# Patient Record
Sex: Female | Born: 1995 | Race: Black or African American | Hispanic: No | Marital: Single | Smoking: Never smoker
Health system: Southern US, Community
[De-identification: ages and names within clinical notes are randomized; demographics above are authoritative.]

## PROBLEM LIST (undated history)

## (undated) DIAGNOSIS — T7840XA Allergy, unspecified, initial encounter: Secondary | ICD-10-CM

## (undated) DIAGNOSIS — Z789 Other specified health status: Secondary | ICD-10-CM

## (undated) HISTORY — PX: NO PAST SURGERIES: SHX2092

---
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
## 2017-01-10 ENCOUNTER — Encounter (HOSPITAL_COMMUNITY): Payer: Self-pay | Admitting: Emergency Medicine

## 2017-01-10 ENCOUNTER — Emergency Department (HOSPITAL_COMMUNITY)
Admission: EM | Admit: 2017-01-10 | Discharge: 2017-01-10 | Disposition: A | Discharge status: Home / Self Care | Payer: Medicaid Other | Attending: Emergency Medicine | Admitting: Emergency Medicine

## 2017-01-10 DIAGNOSIS — N760 Acute vaginitis: Secondary | ICD-10-CM | POA: Insufficient documentation

## 2017-01-10 DIAGNOSIS — R102 Pelvic and perineal pain: Secondary | ICD-10-CM | POA: Diagnosis present

## 2017-01-10 DIAGNOSIS — B9689 Other specified bacterial agents as the cause of diseases classified elsewhere: Secondary | ICD-10-CM | POA: Insufficient documentation

## 2017-01-10 DIAGNOSIS — Z79899 Other long term (current) drug therapy: Secondary | ICD-10-CM | POA: Insufficient documentation

## 2017-01-10 DIAGNOSIS — N3000 Acute cystitis without hematuria: Secondary | ICD-10-CM | POA: Insufficient documentation

## 2017-01-10 LAB — WET PREP, GENITAL
SPERM: NONE SEEN
Trich, Wet Prep: NONE SEEN
Yeast Wet Prep HPF POC: NONE SEEN

## 2017-01-10 LAB — I-STAT BETA HCG BLOOD, ED (MC, WL, AP ONLY): I-stat hCG, quantitative: <5 m[IU]/mL (ref <5)

## 2017-01-10 LAB — URINALYSIS, ROUTINE W REFLEX MICROSCOPIC
BILIRUBIN URINE: NEGATIVE
GLUCOSE, UA: NEGATIVE mg/dL
KETONES UR: NEGATIVE mg/dL
Nitrite: NEGATIVE
PH: 5 (ref 5.0–8.0)
Protein, ur: 100 mg/dL — AB
Specific Gravity, Urine: 1.025 (ref 1.005–1.030)

## 2017-01-10 MED ORDER — METRONIDAZOLE 500 MG PO TABS: 500.0000 mg | ORAL_TABLET | Freq: Two times a day (BID) | ORAL | 0 refills | Status: DC

## 2017-01-10 MED ORDER — FOSFOMYCIN TROMETHAMINE 3 G PO PACK
3.0000 g | PACK | Freq: Once | ORAL | Status: AC
  Administered 2017-01-10: 3 g via ORAL
  Filled 2017-01-10 (×2): qty 3

## 2017-01-10 NOTE — ED Notes (Signed)
[facility]

## 2017-01-10 NOTE — ED Triage Notes (Addendum)
Pt from home with c/o vaginal pain and bleeding, worse with movement, since an IUD was placed 12/11/16.  Pt denies vaginal discharge.  Pt additionally reports burning after urination.  NAD, ambulatory, A&O.

## 2017-01-10 NOTE — Discharge Instructions (Signed)
You were treated today in the emergency department for suspected urinary tract infection. You were wet prep does show bacterial vaginosis, which is infection in the vagina caused by a change in your pH balance. You will be treated with metronidazole. Do not drink alcohol with this medication as it can cause her to become violently ill. Please follow-up with your OB/GYN if she continued to have pain.  Marland Kitchen.Please seek immediate care if you develop the following: There is back pain.  Your symptoms are no better or worse in 3 days. There is severe back pain or lower abdominal pain.  You develop chills.  You have a fever.  There is nausea or vomiting.  There is continued burning or discomfort with urination.

## 2017-01-10 NOTE — ED Provider Notes (Signed)
[facility]-EMERGENCY DEPT Provider Note   CSN: [id number] Arrival date & time: 01/10/17  0705     History   Chief Complaint Chief Complaint  Patient presents with  . Foreign Body in Vagina    HPI Stephanie Vazquez is a 21 y.o. female who presents emergency Department with chief complaint of vaginal pain. She had a drug-eluting IUD placed 1 month ago. One week ago she began having stabbing pain that she feels like is in her vagina. She also noted burning with urination. She denies any vaginal discharge, dyspareunia. She denies pelvic Pain, fevers, flank pain. She has no history of urinary tract infections.  HPI  Past Medical History:  Diagnosis Date  . Allergy    seasonal  . Medical history non-contributory     Patient Active Problem List   Diagnosis Date Noted  . MDD (major depressive disorder), recurrent episode, moderate (HCC) 10/06/2013  . ODD (oppositional defiant disorder) 10/06/2013    Past Surgical History:  Procedure Laterality Date  . NO PAST SURGERIES      OB History    No data available       Home Medications    Prior to Admission medications   Medication Sig Start Date End Date Taking? Authorizing Provider  HYDROcodone-acetaminophen (NORCO/VICODIN) 5-325 MG tablet 1/2 to 1 tablet every 4 hours prn pain Patient not taking: Reported on 01/10/2017 04/04/16   Hayden Rasmussenavid Mabe, NP  loratadine (CLARITIN) 10 MG tablet Take 1 tablet (10 mg total) by mouth daily as needed for allergies. 10/12/13   Chauncey MannGlenn E Jennings, MD  mirtazapine (REMERON) 15 MG tablet Take 1 tablet (15 mg total) by mouth at bedtime and may repeat dose one time if needed. Patient not taking: Reported on 01/10/2017 10/12/13   Chauncey MannGlenn E Jennings, MD    Family History History reviewed. No pertinent family history.  Social History Social History  Substance Use Topics  . Smoking status: Never Smoker  . Smokeless tobacco: Never Used  . Alcohol use No     Allergies   Other   Review of Systems Review  of Systems Ten systems reviewed and are negative for acute change, except as noted in the HPI.    Physical Exam Updated Vital Signs BP 123/79   Pulse 89   Temp 98.2 F (36.8 C) (Oral)   Resp 16   Ht 5\' 2"  (1.575 m)   Wt 71.7 kg   LMP 12/18/2016 (Exact Date)   SpO2 100%   BMI 28.90 kg/m   Physical Exam  Constitutional: She is oriented to person, place, and time. She appears well-developed and well-nourished. No distress.  HENT:  Head: Normocephalic and atraumatic.  Eyes: Conjunctivae are normal. No scleral icterus.  Neck: Normal range of motion.  Cardiovascular: Normal rate, regular rhythm and normal heart sounds.  Exam reveals no gallop and no friction rub.   No murmur heard. Pulmonary/Chest: Effort normal and breath sounds normal. No respiratory distress.  Abdominal: Soft. Bowel sounds are normal. She exhibits no distension and no mass. There is no tenderness. There is no guarding.  Genitourinary:  Genitourinary Comments: Pelvic exam: normal external genitalia, vulva, vagina, cervix, uterus and adnexa. IUD strings present at the cervical os. Fishy vaginal odor. No CMT    Neurological: She is alert and oriented to person, place, and time.  Skin: Skin is warm and dry. She is not diaphoretic.  Nursing note and vitals reviewed.    ED Treatments / Results  Labs (all labs ordered are listed, but  only abnormal results are displayed) Labs Reviewed  WET PREP, GENITAL - Abnormal; Notable for the following:       Result Value   Clue Cells Wet Prep HPF POC PRESENT (*)    WBC, Wet Prep HPF POC FEW (*)    All other components within normal limits  URINALYSIS, ROUTINE W REFLEX MICROSCOPIC - Abnormal; Notable for the following:    APPearance CLOUDY (*)    Hgb urine dipstick LARGE (*)    Protein, ur 100 (*)    Leukocytes, UA LARGE (*)    Bacteria, UA RARE (*)    Squamous Epithelial / LPF 0-5 (*)    All other components within normal limits  URINE CULTURE  RPR  HIV ANTIBODY  (ROUTINE TESTING)  I-STAT BETA HCG BLOOD, ED ([facility], [facility], [facility] ONLY)  GC/CHLAMYDIA PROBE AMP ([place]) NOT AT [facility]    EKG  EKG Interpretation None       Radiology No results found.  Procedures Procedures (including critical care time)  Medications Ordered in ED Medications  fosfomycin (MONUROL) packet 3 g (3 g Oral Given 01/10/17 0909)     Initial Impression / Assessment and Plan / ED Course  I have reviewed the triage vital signs and the nursing notes.  Pertinent labs & imaging results that were available during my care of the patient were reviewed by me and considered in my medical decision making (see chart for details).     With a UTI. Treated here with fosfomycin.   wet prep positive for bacterial vaginosis treated with Flagyl. Follow up with OB for any continued stabbing pain if it's related to position of IUD. She appears safe for discharge and discussed return precautions.  Final Clinical Impressions(s) / ED Diagnoses   Final diagnoses:  Acute cystitis without hematuria  BV (bacterial vaginosis)    New Prescriptions New Prescriptions   No medications on file     Arthor Captain, PA-C 01/10/17 1036    Mancel Bale, MD 01/10/17 1645

## 2017-01-11 LAB — URINE CULTURE

## 2017-01-11 LAB — HIV ANTIBODY (ROUTINE TESTING W REFLEX): HIV Screen 4th Generation wRfx: NONREACTIVE

## 2017-01-11 LAB — RPR: RPR Ser Ql: NONREACTIVE

## 2017-01-11 LAB — GC/CHLAMYDIA PROBE AMP (~~LOC~~) NOT AT ARMC
Chlamydia: NEGATIVE
Neisseria Gonorrhea: NEGATIVE

## 2017-06-22 ENCOUNTER — Encounter (HOSPITAL_COMMUNITY): Payer: Self-pay

## 2017-06-22 ENCOUNTER — Emergency Department (HOSPITAL_COMMUNITY)
Admission: EM | Admit: 2017-06-22 | Discharge: 2017-06-22 | Disposition: A | Discharge status: Home / Self Care | Payer: Medicaid Other | Attending: Emergency Medicine | Admitting: Emergency Medicine

## 2017-06-22 DIAGNOSIS — J36 Peritonsillar abscess: Secondary | ICD-10-CM | POA: Diagnosis not present

## 2017-06-22 DIAGNOSIS — J029 Acute pharyngitis, unspecified: Secondary | ICD-10-CM | POA: Diagnosis present

## 2017-06-22 DIAGNOSIS — Z79899 Other long term (current) drug therapy: Secondary | ICD-10-CM | POA: Insufficient documentation

## 2017-06-22 LAB — RAPID STREP SCREEN (MED CTR MEBANE ONLY): Streptococcus, Group A Screen (Direct): NEGATIVE

## 2017-06-22 MED ORDER — CLINDAMYCIN HCL 150 MG PO CAPS: 450.0000 mg | ORAL_CAPSULE | Freq: Three times a day (TID) | ORAL | 0 refills | Status: DC

## 2017-06-22 MED ORDER — CLINDAMYCIN HCL 150 MG PO CAPS
150.0000 mg | ORAL_CAPSULE | Freq: Once | ORAL | Status: AC
  Administered 2017-06-22: 150 mg via ORAL
  Filled 2017-06-22: qty 1

## 2017-06-22 MED ORDER — DEXAMETHASONE 10 MG/ML FOR PEDIATRIC ORAL USE
10.0000 mg | Freq: Once | INTRAMUSCULAR | Status: AC
  Administered 2017-06-22: 10 mg via ORAL
  Filled 2017-06-22: qty 1

## 2017-06-22 MED ORDER — CLINDAMYCIN HCL 150 MG PO CAPS
300.0000 mg | ORAL_CAPSULE | Freq: Once | ORAL | Status: AC
  Administered 2017-06-22: 300 mg via ORAL
  Filled 2017-06-22: qty 2

## 2017-06-22 NOTE — ED Triage Notes (Signed)
Pt reports sore throat and pain with swallowing foods and liquids; onset Tuesday, associated with muffled voice. Strep swab done in triage. Tonsils appear red and swollen.

## 2017-06-22 NOTE — ED Provider Notes (Signed)
[facility] Provider Note   CSN: [id number] Arrival date & time: 06/22/17  2113     History   Chief Complaint Chief Complaint  Patient presents with  . Sore Throat    HPI Stephanie Vazquez is a 21 y.o. female.  The history is provided by the patient. No language interpreter was used.  Sore Throat     Stephanie Vazquez is a 21 y.o. female who presents to the Emergency Department complaining of sore throat.  She reports 3 days of sore throat, right greater than left. No fevers but she does have pain with swallowing and decreased oral intake. She is able to breathe without difficulty. No prior similar symptoms and no known sick contacts. She has no medical problems. Symptoms are moderate, constant, worsening.  Past Medical History:  Diagnosis Date  . Allergy    seasonal  . Medical history non-contributory     Patient Active Problem List   Diagnosis Date Noted  . MDD (major depressive disorder), recurrent episode, moderate (HCC) 10/06/2013  . ODD (oppositional defiant disorder) 10/06/2013    Past Surgical History:  Procedure Laterality Date  . NO PAST SURGERIES      OB History    No data available       Home Medications    Prior to Admission medications   Medication Sig Start Date End Date Taking? Authorizing Provider  clindamycin (CLEOCIN) 150 MG capsule Take 3 capsules (450 mg total) by mouth 3 (three) times daily. 06/22/17   Tilden Fossaees, Arlester Keehan, MD  HYDROcodone-acetaminophen (NORCO/VICODIN) 5-325 MG tablet 1/2 to 1 tablet every 4 hours prn pain Patient not taking: Reported on 01/10/2017 04/04/16   Hayden RasmussenMabe, David, NP  loratadine (CLARITIN) 10 MG tablet Take 1 tablet (10 mg total) by mouth daily as needed for allergies. 10/12/13   Chauncey MannJennings, Glenn E, MD  metroNIDAZOLE (FLAGYL) 500 MG tablet Take 1 tablet (500 mg total) by mouth 2 (two) times daily. One po bid x 7 days 01/10/17   Arthor CaptainHarris, Abigail, PA-C  mirtazapine (REMERON) 15 MG tablet Take 1 tablet (15 mg total) by mouth  at bedtime and may repeat dose one time if needed. Patient not taking: Reported on 01/10/2017 10/12/13   Chauncey MannJennings, Glenn E, MD    Family History No family history on file.  Social History Social History  Substance Use Topics  . Smoking status: Never Smoker  . Smokeless tobacco: Never Used  . Alcohol use No     Allergies   Other   Review of Systems Review of Systems  All other systems reviewed and are negative.    Physical Exam Updated Vital Signs BP 119/79 (BP Location: Left Arm)   Pulse (!) 101   Temp 99.8 F (37.7 C) (Oral)   Resp 16   SpO2 100%   Physical Exam  Constitutional: She is oriented to person, place, and time. She appears well-developed and well-nourished.  HENT:  Head: Normocephalic and atraumatic.  Mild erythema of the posterior oropharynx, right greater than left. There is mild fullness of the right tonsil and palate.  Neck: Neck supple.  Cardiovascular: Normal rate and regular rhythm.   No murmur heard. Pulmonary/Chest: Effort normal and breath sounds normal. No stridor. No respiratory distress.  Abdominal: Soft. There is no tenderness. There is no rebound and no guarding.  Musculoskeletal: She exhibits no edema or tenderness.  Lymphadenopathy:    She has cervical adenopathy.  Neurological: She is alert and oriented to person, place, and time.  Skin: Skin  is warm and dry.  Psychiatric: She has a normal mood and affect. Her behavior is normal.  Nursing note and vitals reviewed.    ED Treatments / Results  Labs (all labs ordered are listed, but only abnormal results are displayed) Labs Reviewed  RAPID STREP SCREEN (NOT AT [facility])  CULTURE, GROUP A STREP [facility])    EKG  EKG Interpretation None       Radiology No results found.  Procedures Procedures (including critical care time)  Medications Ordered in ED Medications  dexamethasone (DECADRON) 10 MG/ML injection for Pediatric ORAL use 10 mg (10 mg Oral Given 06/22/17 2321)    clindamycin (CLEOCIN) capsule 300 mg (300 mg Oral Given 06/22/17 2321)  clindamycin (CLEOCIN) capsule 150 mg (150 mg Oral Given 06/22/17 2321)     Initial Impression / Assessment and Plan / ED Course  I have reviewed the triage vital signs and the nursing notes.  Pertinent labs & imaging results that were available during my care of the patient were reviewed by me and considered in my medical decision making (see chart for details).     Patient here for evaluation of sore throat. She is nontoxic appearing on examination and well-hydrated. She does have exam findings are concerning for developing peritonsillar abscess. Symptoms are early and exam is not consistent with deep or drainable abscess. Will treat with steroids, clindamycin. Counseled pt on home care with close return precautions if she has worsening symptoms.  Final Clinical Impressions(s) / ED Diagnoses   Final diagnoses:  Peritonsillar abscess    New Prescriptions New Prescriptions   CLINDAMYCIN (CLEOCIN) 150 MG CAPSULE    Take 3 capsules (450 mg total) by mouth 3 (three) times daily.     Tilden Fossaees, Lenor Provencher, MD 06/22/17 [phone]

## 2017-06-24 LAB — CULTURE, GROUP A STREP (THRC)

## 2018-05-19 IMAGING — MR MR KNEE*L* W/O CM
4 of 5 series · 31 of 40 positions shown · non-contrast
Comparison: Radiographs 04/04/2016.

CLINICAL DATA: Anterior left knee pain for 3 weeks after dancing.
Worsening pain with weight-bearing. No previous relevant surgery.

EXAM:
MRI OF THE LEFT KNEE WITHOUT CONTRAST
TECHNIQUE: Multiplanar, multisequence MR imaging of the knee was performed. No
intravenous contrast was administered.

[Series 6: PD fat-sat · axial · left · 3.0mm · 0.39mm/px · z∈[-44,+71]mm · 9 of 36 slices shown (1 of 3)]
[im 1/36]
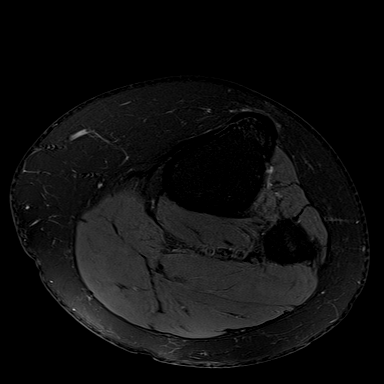
[im 5/36]
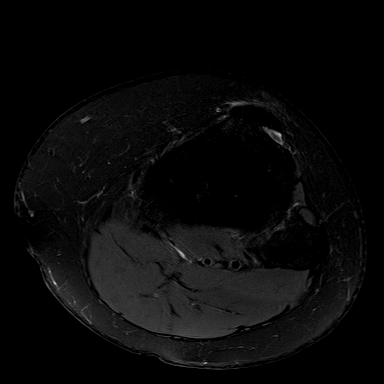
[im 9/36]
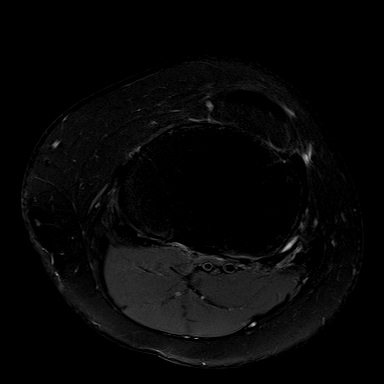
[im 14/36]
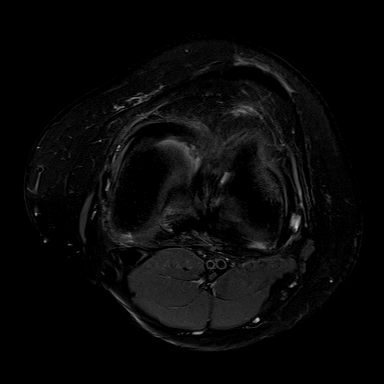
[im 18/36]
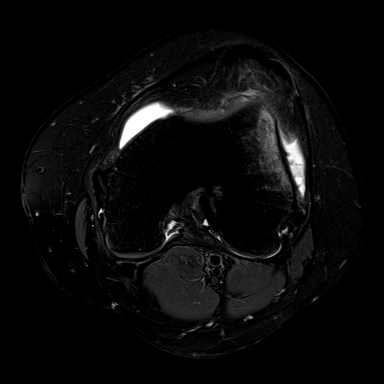
[im 22/36]
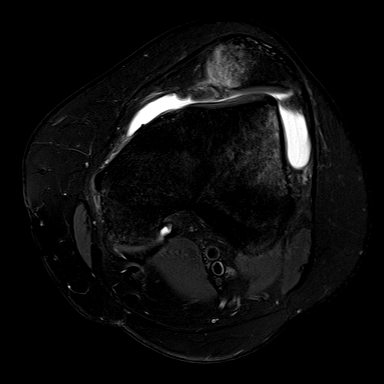
[im 27/36]
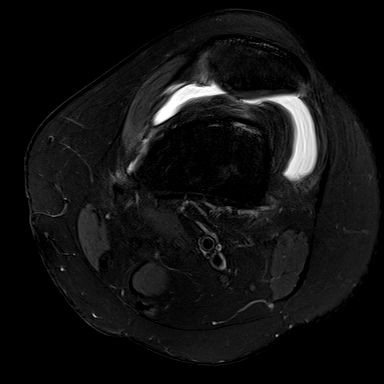
[im 31/36]
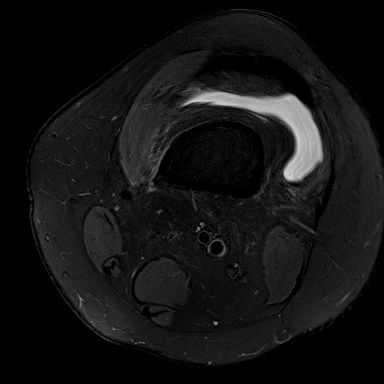
[im 36/36]
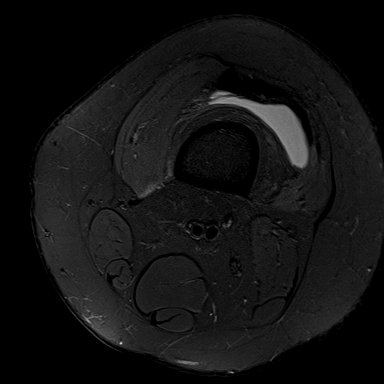

[Series 8: PD fat-sat · coronal · left · 3.0mm · 0.33mm/px · 8 of 33 slices shown (2 of 3)]
[im 1/33]
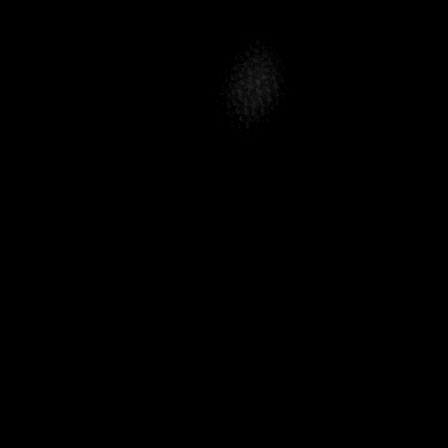
[im 5/33]
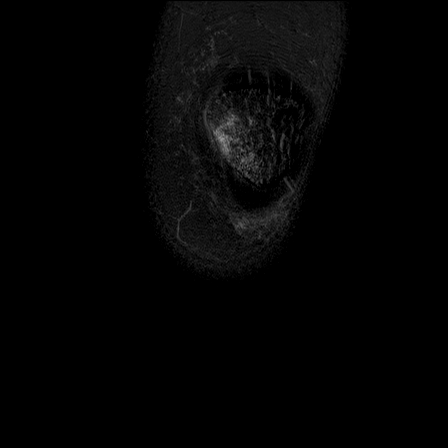
[im 10/33]
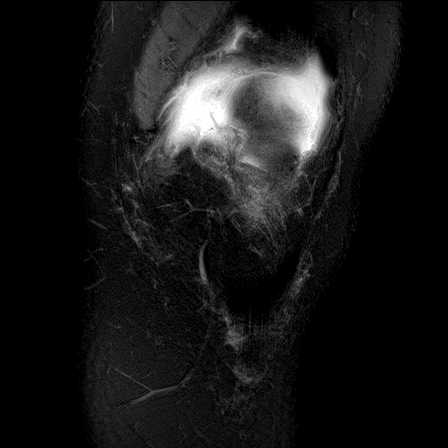
[im 14/33]
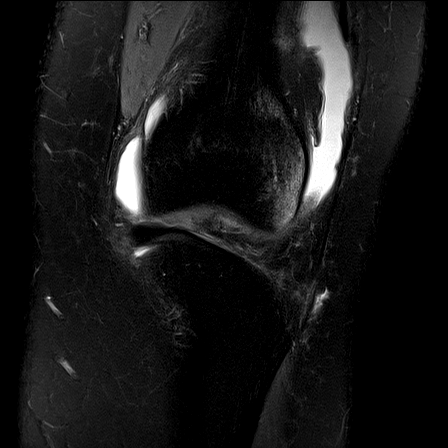
[im 19/33]
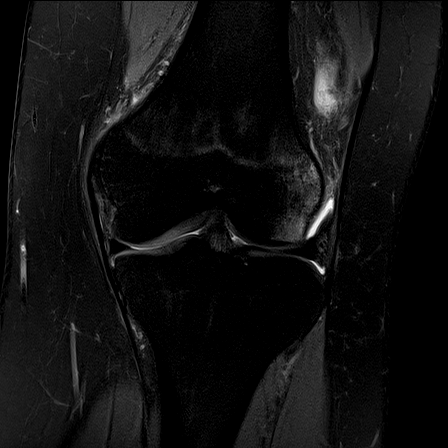
[im 23/33]
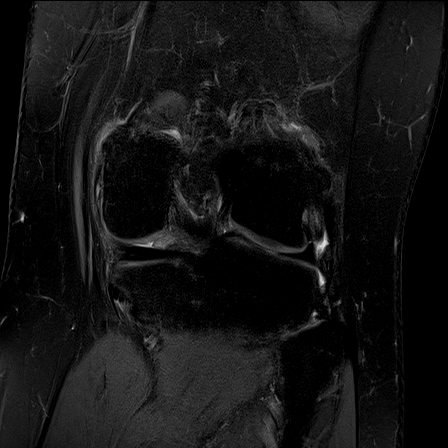
[im 28/33]
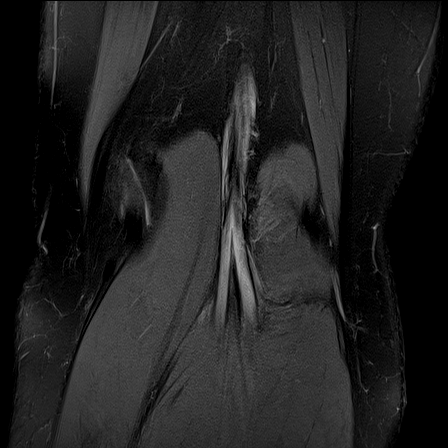
[im 33/33]
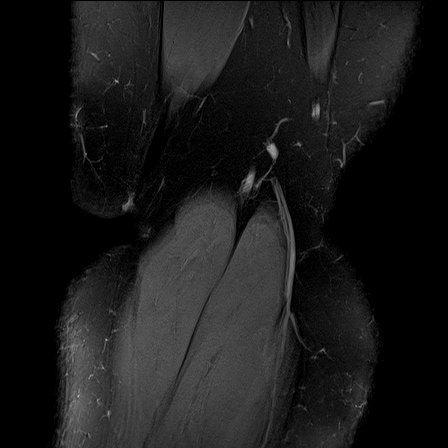

[Series 9: PD fat-sat · sagittal · left · 3.0mm · 0.39mm/px · 7 of 27 slices shown (3 of 3)]
[im 1/27]
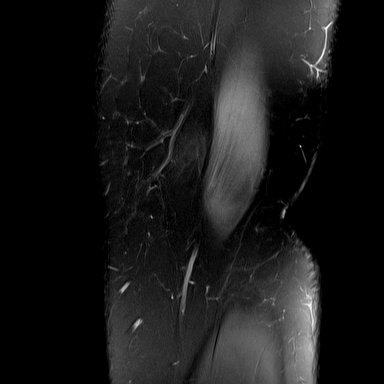
[im 5/27]
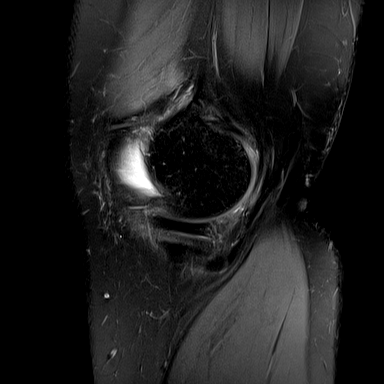
[im 9/27]
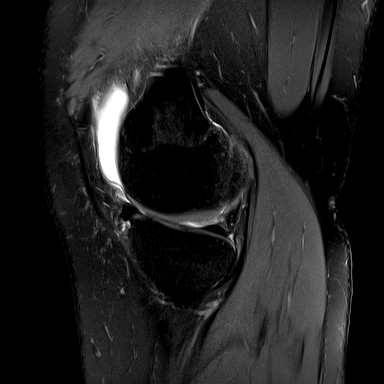
[im 14/27]
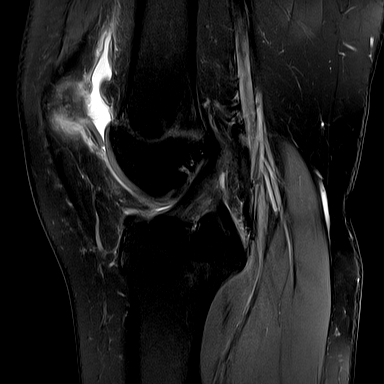
[im 18/27]
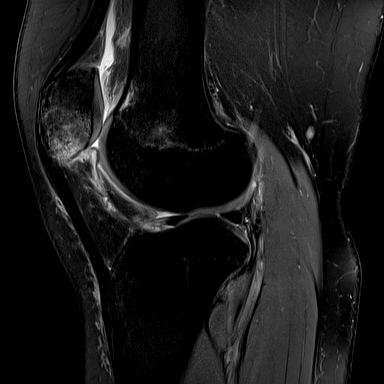
[im 22/27]
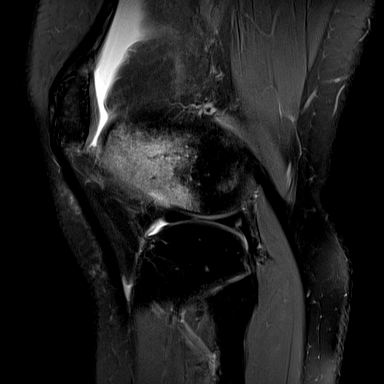
[im 27/27]
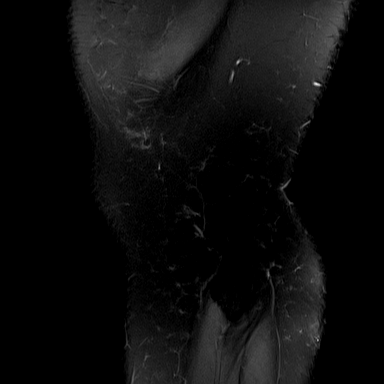

[Series 10: T2 fat-sat · coronal · left · 3.0mm · 0.39mm/px · 7 of 33 slices shown]
[im 1/33]
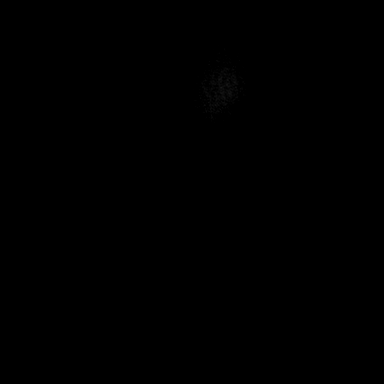
[im 5/33]
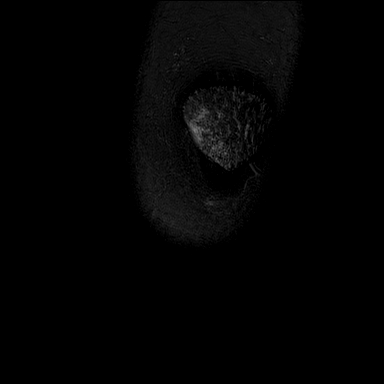
[im 10/33]
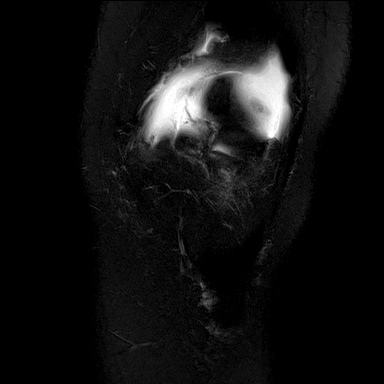
[im 14/33]
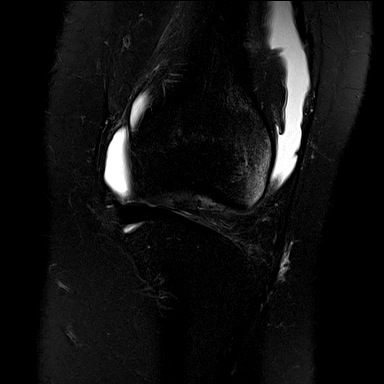
[im 19/33]
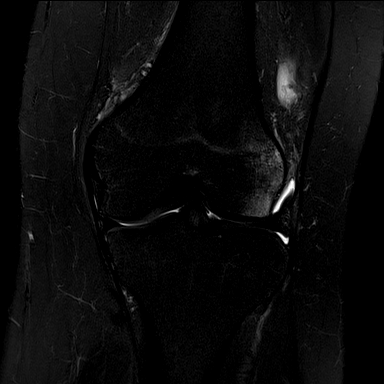
[im 23/33]
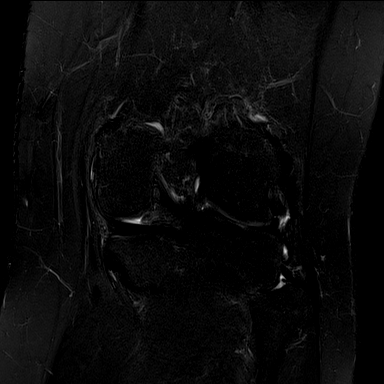
[im 28/33]
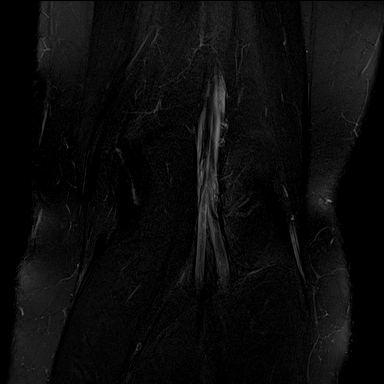

[31 of 40 positions shown; findings below may reference images not displayed]

FINDINGS: MENISCI

Medial meniscus:  Intact with normal morphology.

Lateral meniscus:  Intact with normal morphology.

LIGAMENTS

Cruciates:  Intact.

Collaterals:  Intact.

CARTILAGE

Patellofemoral: There is possible mild irregularity of the cartilage
over the inferior aspect of the medial patellar facet without
apparent full-thickness defect. The trochlear cartilage appears
normal.

Medial:  Preserved.

Lateral:  Preserved.

OTHER

Joint:  Moderate-sized joint effusion.

Popliteal Fossa:  Unremarkable. No significant Baker's cyst.

Extensor Mechanism: Intact. There is lateral tilting of the patella.
There is edema along the patellar attachment of the medial
patellofemoral ligament which appears intact. The femoral trochlea
is shallow. The tibial tubercle/trochlear groove (TT-TG) distance is
17 mm.

Bones: There are bone contusions of the inferomedial patella and the
anterolateral femoral condyle consistent with transient patellar
dislocation injury. There is mild lateral tilting of the patella. A
small chondral fragment medial to patellofemoral joint on sagittal
images 13 and 14 is difficult to exclude, although this is not
clearly seen in the other planes.
IMPRESSION: 1. Findings are consistent with recent transient patellar
dislocation injury with typical osseous injuries involving the
inferior medial patella and the anterior aspect of the lateral
femoral condyle. A small chondral fragment medial to the
patellofemoral joint is difficult to exclude.
2. The medial patellofemoral ligament appears mildly edematous
medially, although intact.
3. The menisci, cruciate and collateral ligaments appear normal.

## 2018-05-23 ENCOUNTER — Ambulatory Visit (HOSPITAL_COMMUNITY)
Admission: EM | Admit: 2018-05-23 | Discharge: 2018-05-23 | Disposition: A | Discharge status: Home / Self Care | Payer: Medicaid Other | Attending: Family Medicine | Admitting: Family Medicine

## 2018-05-23 ENCOUNTER — Encounter (HOSPITAL_COMMUNITY): Payer: Self-pay

## 2018-05-23 DIAGNOSIS — B349 Viral infection, unspecified: Secondary | ICD-10-CM | POA: Diagnosis not present

## 2018-05-23 MED ORDER — ONDANSETRON 4 MG PO TBDP: 4.0000 mg | ORAL_TABLET | Freq: Three times a day (TID) | ORAL | 0 refills | Status: DC | PRN

## 2018-05-23 NOTE — Discharge Instructions (Signed)
Please use zofran as needed for nausea and vomiting. Eat blander foods to begin with. Push fluids.   Tylenol or Ibuprofen for bodyaches, avoid ibuprofen if having abdominal pain/persistent vomiting.  Follow up if symptoms changing, worsening, not getting better.

## 2018-05-23 NOTE — ED Triage Notes (Signed)
Pt presents with cramps all over body, stomache, and headaches.

## 2018-05-24 NOTE — ED Provider Notes (Signed)
[facility]    CSN: [id number] Arrival date & time: 05/23/18  1832     History   Chief Complaint Chief Complaint  Patient presents with  . cramps , heachache, vomiting    HPI Stephanie Vazquez is a 22 y.o. female no contributing past medical history presenting today for evaluation of body aches, headache and stomachache.  Patient states that she recently had a viral illness 1 week ago including cough, congestion and sore throat.  These symptoms have largely resolved.  She still has an occasional cough.  For the past couple days she has had body aches, feeling states she had a's stomachache and had one episode of vomiting this morning.  She is not eating or drinking since.  Last bowel movement was yesterday and was normal, denies diarrhea.  She has not taken anything for her symptoms.  Denies fevers.  She is mainly concerned just to make sure that she is not sick.  HPI  Past Medical History:  Diagnosis Date  . Allergy    seasonal  . Medical history non-contributory     Patient Active Problem List   Diagnosis Date Noted  . MDD (major depressive disorder), recurrent episode, moderate (HCC) 10/06/2013  . ODD (oppositional defiant disorder) 10/06/2013    Past Surgical History:  Procedure Laterality Date  . NO PAST SURGERIES      OB History   None      Home Medications    Prior to Admission medications   Medication Sig Start Date End Date Taking? Authorizing Provider  loratadine (CLARITIN) 10 MG tablet Take 1 tablet (10 mg total) by mouth daily as needed for allergies. 10/12/13  Yes Chauncey MannJennings, Glenn E, MD  clindamycin (CLEOCIN) 150 MG capsule Take 3 capsules (450 mg total) by mouth 3 (three) times daily. 06/22/17   Tilden Fossaees, Elizabeth, MD  HYDROcodone-acetaminophen (NORCO/VICODIN) 5-325 MG tablet 1/2 to 1 tablet every 4 hours prn pain Patient not taking: Reported on 01/10/2017 04/04/16   Hayden RasmussenMabe, David, NP  metroNIDAZOLE (FLAGYL) 500 MG tablet Take 1 tablet (500 mg total)  by mouth 2 (two) times daily. One po bid x 7 days 01/10/17   Arthor CaptainHarris, Abigail, PA-C  mirtazapine (REMERON) 15 MG tablet Take 1 tablet (15 mg total) by mouth at bedtime and may repeat dose one time if needed. Patient not taking: Reported on 01/10/2017 10/12/13   Chauncey MannJennings, Glenn E, MD  ondansetron (ZOFRAN ODT) 4 MG disintegrating tablet Take 1 tablet (4 mg total) by mouth every 8 (eight) hours as needed for nausea or vomiting. 05/23/18   Lew DawesWieters, Hallie C, PA-C    Family History Family History  Problem Relation Age of Onset  . Healthy Mother   . Healthy Father     Social History Social History   Tobacco Use  . Smoking status: Never Smoker  . Smokeless tobacco: Never Used  Substance Use Topics  . Alcohol use: No  . Drug use: No     Allergies   Other   Review of Systems Review of Systems  Constitutional: Positive for appetite change. Negative for activity change, chills, fatigue and fever.  HENT: Negative for congestion, ear pain, rhinorrhea, sinus pressure, sore throat and trouble swallowing.   Eyes: Negative for discharge and redness.  Respiratory: Positive for cough. Negative for chest tightness and shortness of breath.   Cardiovascular: Negative for chest pain.  Gastrointestinal: Positive for vomiting. Negative for abdominal pain, diarrhea and nausea.  Musculoskeletal: Positive for myalgias.  Skin: Negative for rash.  Neurological: Positive for headaches. Negative for dizziness and light-headedness.     Physical Exam Triage Vital Signs ED Triage Vitals  Enc Vitals Group     BP 05/23/18 1851 121/85     Pulse Rate 05/23/18 1851 80     Resp 05/23/18 1851 19     Temp 05/23/18 1851 98.2 F (36.8 C)     Temp Source 05/23/18 1851 Oral     SpO2 05/23/18 1851 100 %     Weight --      Height --      Head Circumference --      Peak Flow --      Pain Score 05/23/18 1848 0     Pain Loc --      Pain Edu? --      Excl. in GC? --    No data found.  Updated Vital Signs BP  121/85 (BP Location: Left Arm)   Pulse 80   Temp 98.2 F (36.8 C) (Oral)   Resp 19   SpO2 100%   Visual Acuity Right Eye Distance:   Left Eye Distance:   Bilateral Distance:    Right Eye Near:   Left Eye Near:    Bilateral Near:     Physical Exam  Constitutional: She is oriented to person, place, and time. She appears well-developed and well-nourished. No distress.  HENT:  Head: Normocephalic and atraumatic.  Mouth/Throat: Oropharynx is clear and moist.  Bilateral ears without tenderness to palpation of external auricle, tragus and mastoid, EAC's without erythema or swelling, TM's with good bony landmarks and cone of light. Non erythematous.  Oral mucosa pink and moist, no tonsillar enlargement or exudate. Posterior pharynx patent and nonerythematous, no uvula deviation or swelling. Normal phonation.  Eyes: Conjunctivae are normal.  Neck: Neck supple.  Cardiovascular: Normal rate and regular rhythm.  No murmur heard. Pulmonary/Chest: Effort normal and breath sounds normal. No respiratory distress.  Breathing comfortably at rest, CTABL, no wheezing, rales or other adventitious sounds auscultated  Abdominal: Soft. There is no tenderness.  Nontender to light and deep palpation throughout all 4 quadrants  Musculoskeletal: She exhibits no edema.  Neurological: She is alert and oriented to person, place, and time.  Skin: Skin is warm and dry.  Psychiatric: She has a normal mood and affect.  Nursing note and vitals reviewed.    UC Treatments / Results  Labs (all labs ordered are listed, but only abnormal results are displayed) Labs Reviewed - No data to display  EKG None  Radiology No results found.  Procedures Procedures (including critical care time)  Medications Ordered in UC Medications - No data to display  Initial Impression / Assessment and Plan / UC Course  I have reviewed the triage vital signs and the nursing notes.  Pertinent labs & imaging results that  were available during my care of the patient were reviewed by me and considered in my medical decision making (see chart for details).     Patient with body aches, one episode of vomiting mild headache.  Vital signs stable, exam unremarkable.  Recommend symptomatic management at this time.  Tylenol and ibuprofen for body aches and headache.  Zofran as needed if vomiting persists.  Discussed diet recommendations.Discussed strict return precautions. Patient verbalized understanding and is agreeable with plan.  Final Clinical Impressions(s) / UC Diagnoses   Final diagnoses:  Viral illness     Discharge Instructions     Please use zofran as needed for nausea and vomiting. Eat  blander foods to begin with. Push fluids.   Tylenol or Ibuprofen for bodyaches, avoid ibuprofen if having abdominal pain/persistent vomiting.  Follow up if symptoms changing, worsening, not getting better.   ED Prescriptions    Medication Sig Dispense Auth. Provider   ondansetron (ZOFRAN ODT) 4 MG disintegrating tablet Take 1 tablet (4 mg total) by mouth every 8 (eight) hours as needed for nausea or vomiting. 20 tablet Wieters, [place] C, PA-C     Controlled Substance Prescriptions [place] Controlled Substance Registry consulted? Not Applicable   Lew Dawes, [place] 05/24/18 1021

## 2018-10-15 ENCOUNTER — Encounter (HOSPITAL_COMMUNITY): Payer: Self-pay

## 2018-10-15 ENCOUNTER — Ambulatory Visit (HOSPITAL_COMMUNITY)
Admission: EM | Admit: 2018-10-15 | Discharge: 2018-10-15 | Disposition: A | Discharge status: Home / Self Care | Payer: Self-pay | Attending: Family Medicine | Admitting: Family Medicine

## 2018-10-15 DIAGNOSIS — L03011 Cellulitis of right finger: Secondary | ICD-10-CM

## 2018-10-15 MED ORDER — SULFAMETHOXAZOLE-TRIMETHOPRIM 800-160 MG PO TABS: 1.0000 | ORAL_TABLET | Freq: Two times a day (BID) | ORAL | 0 refills | Status: AC

## 2018-10-15 NOTE — ED Triage Notes (Signed)
Pt presents with an infection of right ring finger not related to any trauma or injury.

## 2018-10-15 NOTE — Discharge Instructions (Signed)
You may use over the counter ibuprofen or acetaminophen as needed for pain. °

## 2018-10-15 NOTE — ED Provider Notes (Signed)
[facility]   [id number] 10/15/18 Arrival Time: 1313  ASSESSMENT & PLAN:  1. Paronychia of finger of right hand     Incision and Drainage Procedure Note; Paronychia of R 4th finger  Anesthesia: ethyl chloride spray  Procedure Details  The procedure, risks and complications have been discussed in detail (including, but not limited to pain and bleeding) with the patient.  The skin induration was prepped and draped in the usual fashion. After adequate local anesthesia, I&D with a #11 blade was performed on the right 4th finger. Purulent drainage: present.  EBL: minimal; easily controlled  Drains: none  Condition: Tolerated procedure well  Complications: none.  Meds ordered this encounter  Medications  . sulfamethoxazole-trimethoprim (BACTRIM DS,SEPTRA DS) 800-160 MG tablet    Sig: Take 1 tablet by mouth 2 (two) times daily for 7 days.    Dispense:  14 tablet    Refill:  0   Follow-up Information    [facility].   Specialty:  Urgent Care Why:  If needed. Contact information: [address] [place] [address] [phone]         Wound care instructions discussed and given in written format. To return in 48 hours for wound check if needed.  Finish all antibiotics. OTC analgesics as needed.  Reviewed expectations re: course of current medical issues. Questions answered. Outlined signs and symptoms indicating need for more acute intervention. Patient verbalized understanding. After Visit Summary given.   SUBJECTIVE:  Stephanie Vazquez is a 22 y.o. female who presents with a possible abscess of her R 4th distal finger, around nail. Bites nails regularly. H/O similar in the past. Afebrile. No home/OTC treatment. Onset gradual, approximately several days ago. Now more painful and swollen. No extremity sensation changes or weakness.  ROS: As per HPI.  OBJECTIVE:  Vitals:   10/15/18 1404  BP: 122/78    Pulse: 83  Resp: 20  Temp: 98.4 F (36.9 C)  TempSrc: Oral  SpO2: 100%    General appearance: alert; no distress Skin: paronychia around nailfold of her R 5th finger; very tender to touch; no active drainage or bleeding; FROM of R 4th finger with normal capillary refill and normal distal sensation; does have erythema of R 4th finger extending from nailfold to DIP; warm to touch Psychological: alert and cooperative; normal mood and affect  Allergies  Allergen Reactions  . Other     Seasonal allergies    Past Medical History:  Diagnosis Date  . Allergy    seasonal  . Medical history non-contributory    Social History   Socioeconomic History  . Marital status: Single    Spouse name: Not on file  . Number of children: Not on file  . Years of education: Not on file  . Highest education level: Not on file  Occupational History  . Not on file  Social Needs  . Financial resource strain: Not on file  . Food insecurity:    Worry: Not on file    Inability: Not on file  . Transportation needs:    Medical: Not on file    Non-medical: Not on file  Tobacco Use  . Smoking status: Never Smoker  . Smokeless tobacco: Never Used  Substance and Sexual Activity  . Alcohol use: No  . Drug use: No  . Sexual activity: Never  Lifestyle  . Physical activity:    Days per week: Not on file    Minutes per  session: Not on file  . Stress: Not on file  Relationships  . Social connections:    Talks on phone: Not on file    Gets together: Not on file    Attends religious service: Not on file    Active member of club or organization: Not on file    Attends meetings of clubs or organizations: Not on file    Relationship status: Not on file  Other Topics Concern  . Not on file  Social History Narrative  . Not on file   Family History  Problem Relation Age of Onset  . Healthy Mother   . Healthy Father    Past Surgical History:  Procedure Laterality Date  . NO PAST SURGERIES              Mardella LaymanHagler, Caniyah Murley, MD 10/15/18 1445

## 2019-12-08 ENCOUNTER — Encounter (HOSPITAL_COMMUNITY): Payer: Self-pay

## 2019-12-08 ENCOUNTER — Other Ambulatory Visit: Payer: Self-pay

## 2019-12-08 ENCOUNTER — Ambulatory Visit (HOSPITAL_COMMUNITY)
Admission: EM | Admit: 2019-12-08 | Discharge: 2019-12-08 | Disposition: A | Discharge status: Home / Self Care | Payer: Medicaid Other | Attending: Family Medicine | Admitting: Family Medicine

## 2019-12-08 DIAGNOSIS — H9201 Otalgia, right ear: Secondary | ICD-10-CM

## 2019-12-08 DIAGNOSIS — H6121 Impacted cerumen, right ear: Secondary | ICD-10-CM

## 2019-12-08 NOTE — ED Triage Notes (Signed)
Pt states she has right ear pain. Pt states this has been going on for 2 days.

## 2019-12-08 NOTE — ED Provider Notes (Signed)
[facility]   [id number] 12/08/19 Arrival Time: 1729  ASSESSMENT & PLAN:  1. Otalgia of right ear   2. Impacted cerumen of right ear     Reports improvement after ear flushing. No signs of infection.  Follow-up Information    Pudlo, Gennie Alma, MD.   Specialty: Pediatrics Why: If worsening or failing to improve as anticipated. Contact information: [employer] [address] [place] [address] [phone]            Reviewed expectations re: course of current medical issues. Questions answered. Outlined signs and symptoms indicating need for more acute intervention. Patient verbalized understanding. After Visit Summary given.   SUBJECTIVE: History from: patient.  Stephanie Vazquez is a 24 y.o. female who presents with complaint of right otalgia; without drainage; without bleeding. Onset gradual, over the past two days. Recent cold symptoms: none. Fever: no. Overall normal PO intake without n/v. Sick contacts: no. OTC treatment: H2O2 without relief. Slightly decreased hearing.  Social History   Tobacco Use  Smoking Status Never Smoker  Smokeless Tobacco Never Used    ROS: As per HPI.   OBJECTIVE:  Vitals:   12/08/19 1826 12/08/19 1827  BP:  126/73  Pulse:  87  Resp:  18  Temp:  98.5 F (36.9 C)  TempSrc:  Oral  SpO2:  100%  Weight: 77.1 kg      General appearance: alert; NA Ear Canal: cerumen on the right TM: visible portions normal bilaterally Neck: supple without LAD Lungs: unlabored respirations, symmetrical air entry; cough: absent; no respiratory distress Skin: warm and dry Psychological: alert and cooperative; normal mood and affect  Allergies  Allergen Reactions  . Other     Seasonal allergies    Past Medical History:  Diagnosis Date  . Allergy    seasonal  . Medical history non-contributory    Family History  Problem Relation Age of Onset  . Healthy Mother   . Healthy Father     Social History   Socioeconomic History  . Marital status: Single    Spouse name: Not on file  . Number of children: Not on file  . Years of education: Not on file  . Highest education level: Not on file  Occupational History  . Not on file  Tobacco Use  . Smoking status: Never Smoker  . Smokeless tobacco: Never Used  Substance and Sexual Activity  . Alcohol use: Yes  . Drug use: No  . Sexual activity: Never  Other Topics Concern  . Not on file  Social History Narrative  . Not on file   Social Determinants of Health   Financial Resource Strain:   . Difficulty of Paying Living Expenses: Not on file  Food Insecurity:   . Worried About Programme researcher, broadcasting/film/video in the Last Year: Not on file  . Ran Out of Food in the Last Year: Not on file  Transportation Needs:   . Lack of Transportation (Medical): Not on file  . Lack of Transportation (Non-Medical): Not on file  Physical Activity:   . Days of Exercise per Week: Not on file  . Minutes of Exercise per Session: Not on file  Stress:   . Feeling of Stress : Not on file  Social Connections:   . Frequency of Communication with Friends and Family: Not on file  . Frequency of Social Gatherings with Friends and Family: Not on file  . Attends Religious Services: Not on file  .  Active Member of Clubs or Organizations: Not on file  . Attends Archivist Meetings: Not on file  . Marital Status: Not on file  Intimate Partner Violence:   . Fear of Current or Ex-Partner: Not on file  . Emotionally Abused: Not on file  . Physically Abused: Not on file  . Sexually Abused: Not on file            Vanessa Kick, MD 12/08/19 1931

## 2020-06-14 ENCOUNTER — Other Ambulatory Visit: Payer: Self-pay

## 2020-06-14 ENCOUNTER — Ambulatory Visit (HOSPITAL_COMMUNITY)
Admission: EM | Admit: 2020-06-14 | Discharge: 2020-06-14 | Disposition: A | Discharge status: Home / Self Care | Payer: Medicaid Other | Attending: Family Medicine | Admitting: Family Medicine

## 2020-06-14 ENCOUNTER — Encounter (HOSPITAL_COMMUNITY): Payer: Self-pay

## 2020-06-14 DIAGNOSIS — Z202 Contact with and (suspected) exposure to infections with a predominantly sexual mode of transmission: Secondary | ICD-10-CM | POA: Insufficient documentation

## 2020-06-14 NOTE — Discharge Instructions (Signed)
Your test results will be available through my chart . you will be called if any of your test results are positive

## 2020-06-14 NOTE — ED Provider Notes (Signed)
[facility]    CSN: [id number] Arrival date & time: 06/14/20  1533      History   Chief Complaint Chief Complaint  Patient presents with  . Exposure to STD    HPI Stephanie Vazquez is a 24 y.o. female.   HPI   Patient has no symptoms.  She states she would like STD testing "to be careful". Admits to unprotected sex States she has an IUD and is certain she is not pregnant  Past Medical History:  Diagnosis Date  . Allergy    seasonal  . Medical history non-contributory     Patient Active Problem List   Diagnosis Date Noted  . MDD (major depressive disorder), recurrent episode, moderate (HCC) 10/06/2013  . ODD (oppositional defiant disorder) 10/06/2013    Past Surgical History:  Procedure Laterality Date  . NO PAST SURGERIES      OB History   No obstetric history on file.      Home Medications    Prior to Admission medications   Medication Sig Start Date End Date Taking? Authorizing Provider  levonorgestrel (KYLEENA) 19.5 MG IUD Kyleena 17.5 mcg/24 hrs (20yrs) 19.5mg  intrauterine device  Take 1 device by intrauterine route.    [provider]  ondansetron (ZOFRAN ODT) 4 MG disintegrating tablet Take 1 tablet (4 mg total) by mouth every 8 (eight) hours as needed for nausea or vomiting. 05/23/18   Wieters, Hallie C, PA-C  mirtazapine (REMERON) 15 MG tablet Take 1 tablet (15 mg total) by mouth at bedtime and may repeat dose one time if needed. Patient not taking: Reported on 01/10/2017 10/12/13 06/14/20  Chauncey Mann, MD    Family History Family History  Problem Relation Age of Onset  . Healthy Mother   . Healthy Father     Social History Social History   Tobacco Use  . Smoking status: Never Smoker  . Smokeless tobacco: Never Used  Vaping Use  . Vaping Use: Never used  Substance Use Topics  . Alcohol use: Yes  . Drug use: No     Allergies   No known allergies and Other   Review of Systems Review of Systems See  HPI  Physical Exam Triage Vital Signs ED Triage Vitals  Enc Vitals Group     BP 06/14/20 1627 135/83     Pulse Rate 06/14/20 1627 87     Resp 06/14/20 1627 18     Temp 06/14/20 1627 98.6 F (37 C)     Temp Source 06/14/20 1627 Oral     SpO2 06/14/20 1627 100 %     Weight --      Height --      Head Circumference --      Peak Flow --      Pain Score 06/14/20 1626 0     Pain Loc --      Pain Edu? --      Excl. in GC? --    No data found.  Updated Vital Signs BP 135/83 (BP Location: Left Arm)   Pulse 87   Temp 98.6 F (37 C) (Oral)   Resp 18   SpO2 100%      Physical Exam Constitutional:      General: She is not in acute distress.    Appearance: She is well-developed.  HENT:     Head: Normocephalic and atraumatic.     Mouth/Throat:     Comments: See mask Eyes:     Conjunctiva/sclera: Conjunctivae normal.  Pupils: Pupils are equal, round, and reactive to light.  Cardiovascular:     Rate and Rhythm: Normal rate.  Pulmonary:     Effort: Pulmonary effort is normal. No respiratory distress.  Abdominal:     General: There is no distension.     Palpations: Abdomen is soft.  Genitourinary:    Comments: Obtain self swab Musculoskeletal:        General: Normal range of motion.     Cervical back: Normal range of motion.  Skin:    General: Skin is warm and dry.  Neurological:     Mental Status: She is alert.  Psychiatric:        Mood and Affect: Mood normal.        Behavior: Behavior normal.      UC Treatments / Results  Labs (all labs ordered are listed, but only abnormal results are displayed) Labs Reviewed  CERVICOVAGINAL ANCILLARY ONLY    EKG   Radiology No results found.  Procedures Procedures (including critical care time)  Medications Ordered in UC Medications - No data to display  Initial Impression / Assessment and Plan / UC Course  I have reviewed the triage vital signs and the nursing notes.  Pertinent labs & imaging results that  were available during my care of the patient were reviewed by me and considered in my medical decision making (see chart for details).     Final Clinical Impressions(s) / UC Diagnoses   Final diagnoses:  Possible exposure to STD     Discharge Instructions     Your test results will be available through my chart . you will be called if any of your test results are positive    ED Prescriptions    None     PDMP not reviewed this encounter.   Eustace Moore, MD 06/14/20 2131

## 2020-06-14 NOTE — ED Triage Notes (Signed)
Pt requests STI testing. Denies vag discharge/bleeding, fever, chills, abdominal pain, n/v/d or dysuria sx.

## 2020-06-15 ENCOUNTER — Telehealth (HOSPITAL_COMMUNITY): Payer: Self-pay

## 2020-06-15 LAB — CERVICOVAGINAL ANCILLARY ONLY
Chlamydia: NEGATIVE
Comment: NEGATIVE
Comment: NEGATIVE
Comment: NORMAL
Neisseria Gonorrhea: NEGATIVE
Trichomonas: POSITIVE — AB

## 2020-06-15 MED ORDER — METRONIDAZOLE 500 MG PO TABS: 500.0000 mg | ORAL_TABLET | Freq: Two times a day (BID) | ORAL | 0 refills | Status: DC

## 2020-07-16 ENCOUNTER — Ambulatory Visit (HOSPITAL_COMMUNITY)
Admission: EM | Admit: 2020-07-16 | Discharge: 2020-07-16 | Disposition: A | Discharge status: Home / Self Care | Payer: Medicaid Other | Attending: Emergency Medicine | Admitting: Emergency Medicine

## 2020-07-16 ENCOUNTER — Encounter (HOSPITAL_COMMUNITY): Payer: Self-pay | Admitting: *Deleted

## 2020-07-16 ENCOUNTER — Other Ambulatory Visit: Payer: Self-pay

## 2020-07-16 DIAGNOSIS — Z20822 Contact with and (suspected) exposure to covid-19: Secondary | ICD-10-CM | POA: Diagnosis present

## 2020-07-16 DIAGNOSIS — J069 Acute upper respiratory infection, unspecified: Secondary | ICD-10-CM | POA: Diagnosis not present

## 2020-07-16 MED ORDER — BENZONATATE 200 MG PO CAPS: 200.0000 mg | ORAL_CAPSULE | Freq: Three times a day (TID) | ORAL | 0 refills | Status: AC | PRN

## 2020-07-16 MED ORDER — FLUTICASONE PROPIONATE 50 MCG/ACT NA SUSP: 1.0000 | Freq: Every day | NASAL | 0 refills | Status: DC

## 2020-07-16 MED ORDER — IBUPROFEN 800 MG PO TABS: 800.0000 mg | ORAL_TABLET | Freq: Three times a day (TID) | ORAL | 0 refills | Status: DC

## 2020-07-16 NOTE — Discharge Instructions (Signed)
Covid test pending, monitor my chart for results Quarantine for 10 days Tessalon/benzonatate every 8 hours as needed for cough Flonase nasal spray 1 to 2 spray in each nostril Daily ibuprofen and Tylenol for headaches, body aches Rest and fluids Follow-up if not improving

## 2020-07-16 NOTE — ED Triage Notes (Addendum)
C/O cough, sore throat, runny nose, body aches, fatigue x 4 days without known fevers.  Pt states boyfriend is Covid +.

## 2020-07-17 LAB — SARS CORONAVIRUS 2 (TAT 6-24 HRS): SARS Coronavirus 2: POSITIVE — AB

## 2020-07-17 NOTE — ED Provider Notes (Signed)
[facility]    CSN: [id number] Arrival date & time: 07/16/20  1108      History   Chief Complaint Chief Complaint  Patient presents with  . Cough  . Generalized Body Aches    HPI DEVITA NIES is a 24 y.o. female presenting today for evaluation of URI symptoms.  Patient reports over the past 4 days she has had cough, sore throat, rhinorrhea, body aches and fatigue.  Reports boyfriend recently tested positive for Covid.  She denies any GI symptoms.  Denies chest pain or shortness of breath.  HPI  Past Medical History:  Diagnosis Date  . Allergy    seasonal    Patient Active Problem List   Diagnosis Date Noted  . MDD (major depressive disorder), recurrent episode, moderate (HCC) 10/06/2013  . ODD (oppositional defiant disorder) 10/06/2013    Past Surgical History:  Procedure Laterality Date  . NO PAST SURGERIES      OB History   No obstetric history on file.      Home Medications    Prior to Admission medications   Medication Sig Start Date End Date Taking? Authorizing Provider  levonorgestrel (KYLEENA) 19.5 MG IUD Kyleena 17.5 mcg/24 hrs (5yrs) 19.5mg  intrauterine device  Take 1 device by intrauterine route.   Yes [provider]  benzonatate (TESSALON) 200 MG capsule Take 1 capsule (200 mg total) by mouth 3 (three) times daily as needed for up to 7 days for cough. 07/16/20 07/23/20  Rayven Hendrickson C, PA-C  fluticasone (FLONASE) 50 MCG/ACT nasal spray Place 1-2 sprays into both nostrils daily for 7 days. 07/16/20 07/23/20  Khelani Kops C, PA-C  ibuprofen (ADVIL) 800 MG tablet Take 1 tablet (800 mg total) by mouth 3 (three) times daily. 07/16/20   Amely Voorheis C, PA-C  mirtazapine (REMERON) 15 MG tablet Take 1 tablet (15 mg total) by mouth at bedtime and may repeat dose one time if needed. Patient not taking: Reported on 01/10/2017 10/12/13 06/14/20  Chauncey Mann, MD    Family History Family History  Problem Relation Age of Onset  .  Healthy Mother   . Healthy Father     Social History Social History   Tobacco Use  . Smoking status: Never Smoker  . Smokeless tobacco: Never Used  Vaping Use  . Vaping Use: Never used  Substance Use Topics  . Alcohol use: Yes    Comment: occasionally  . Drug use: No     Allergies   No known allergies and Other   Review of Systems Review of Systems  Constitutional: Positive for chills and fatigue. Negative for activity change, appetite change and fever.  HENT: Positive for congestion, rhinorrhea and sore throat. Negative for ear pain, sinus pressure and trouble swallowing.   Eyes: Negative for discharge and redness.  Respiratory: Positive for cough. Negative for chest tightness and shortness of breath.   Cardiovascular: Negative for chest pain.  Gastrointestinal: Negative for abdominal pain, diarrhea, nausea and vomiting.  Musculoskeletal: Negative for myalgias.  Skin: Negative for rash.  Neurological: Positive for headaches. Negative for dizziness and light-headedness.     Physical Exam Triage Vital Signs ED Triage Vitals  Enc Vitals Group     BP 07/16/20 1237 128/86     Pulse Rate 07/16/20 1237 87     Resp 07/16/20 1237 18     Temp 07/16/20 1237 98.5 F (36.9 C)     Temp Source 07/16/20 1237 Oral     SpO2 07/16/20 1237  98 %     Weight --      Height --      Head Circumference --      Peak Flow --      Pain Score 07/16/20 1238 6     Pain Loc --      Pain Edu? --      Excl. in [place]? --    No data found.  Updated Vital Signs BP 128/86   Pulse 87   Temp 98.5 F (36.9 C) (Oral)   Resp 18   LMP 07/04/2020 (Approximate)   SpO2 98%   Visual Acuity Right Eye Distance:   Left Eye Distance:   Bilateral Distance:    Right Eye Near:   Left Eye Near:    Bilateral Near:     Physical Exam Vitals and nursing note reviewed.  Constitutional:      Appearance: She is well-developed.     Comments: No acute distress  HENT:     Head: Normocephalic and  atraumatic.     Ears:     Comments: Bilateral ears without tenderness to palpation of external auricle, tragus and mastoid, EAC's without erythema or swelling, TM's with good bony landmarks and cone of light. Non erythematous.     Nose: Nose normal.     Mouth/Throat:     Comments: Oral mucosa pink and moist, no tonsillar enlargement or exudate. Posterior pharynx patent and nonerythematous, no uvula deviation or swelling. Normal phonation. Eyes:     Conjunctiva/sclera: Conjunctivae normal.  Cardiovascular:     Rate and Rhythm: Normal rate.  Pulmonary:     Effort: Pulmonary effort is normal. No respiratory distress.     Comments: Breathing comfortably at rest, CTABL, no wheezing, rales or other adventitious sounds auscultated Abdominal:     General: There is no distension.  Musculoskeletal:        General: Normal range of motion.     Cervical back: Neck supple.  Skin:    General: Skin is warm and dry.  Neurological:     Mental Status: She is alert and oriented to person, place, and time.      UC Treatments / Results  Labs (all labs ordered are listed, but only abnormal results are displayed) Labs Reviewed  SARS CORONAVIRUS 2 (TAT 6-24 HRS) - Abnormal; Notable for the following components:      Result Value   SARS Coronavirus 2 POSITIVE (*)    All other components within normal limits    EKG   Radiology No results found.  Procedures Procedures (including critical care time)  Medications Ordered in UC Medications - No data to display  Initial Impression / Assessment and Plan / UC Course  I have reviewed the triage vital signs and the nursing notes.  Pertinent labs & imaging results that were available during my care of the patient were reviewed by me and considered in my medical decision making (see chart for details).     URI symptoms x4 days with recent Covid exposure.  Covid test pending, suspect symptoms likely Covid.  Recommending symptomatic and supportive  care with close monitoring.  Rest and fluids.  Discussed strict return precautions. Patient verbalized understanding and is agreeable with plan.  Final Clinical Impressions(s) / UC Diagnoses   Final diagnoses:  Viral URI with cough  Suspected COVID-19 virus infection     Discharge Instructions     Covid test pending, monitor my chart for results Quarantine for 10 days Tessalon/benzonatate every 8 hours as  needed for cough Flonase nasal spray 1 to 2 spray in each nostril Daily ibuprofen and Tylenol for headaches, body aches Rest and fluids Follow-up if not improving   ED Prescriptions    Medication Sig Dispense Auth. Provider   benzonatate (TESSALON) 200 MG capsule Take 1 capsule (200 mg total) by mouth 3 (three) times daily as needed for up to 7 days for cough. 28 capsule Ziah Leandro C, PA-C   fluticasone (FLONASE) 50 MCG/ACT nasal spray Place 1-2 sprays into both nostrils daily for 7 days. 1 g Inna Tisdell C, PA-C   ibuprofen (ADVIL) 800 MG tablet Take 1 tablet (800 mg total) by mouth 3 (three) times daily. 21 tablet Finlee Concepcion, [place] C, PA-C     PDMP not reviewed this encounter.   Guenevere Roorda, [place] C, PA-C 07/17/20 1010

## 2020-10-06 ENCOUNTER — Ambulatory Visit (HOSPITAL_COMMUNITY)
Admission: EM | Admit: 2020-10-06 | Discharge: 2020-10-06 | Disposition: A | Discharge status: Home / Self Care | Payer: Self-pay | Attending: Family Medicine | Admitting: Family Medicine

## 2020-10-06 ENCOUNTER — Other Ambulatory Visit: Payer: Self-pay

## 2020-10-06 ENCOUNTER — Encounter (HOSPITAL_COMMUNITY): Payer: Self-pay

## 2020-10-06 DIAGNOSIS — R1084 Generalized abdominal pain: Secondary | ICD-10-CM

## 2020-10-06 DIAGNOSIS — Z3202 Encounter for pregnancy test, result negative: Secondary | ICD-10-CM

## 2020-10-06 DIAGNOSIS — K5900 Constipation, unspecified: Secondary | ICD-10-CM

## 2020-10-06 LAB — POC URINE PREG, ED: Preg Test, Ur: NEGATIVE

## 2020-10-06 LAB — POCT URINALYSIS DIPSTICK, ED / UC
Bilirubin Urine: NEGATIVE
Glucose, UA: NEGATIVE mg/dL
Ketones, ur: NEGATIVE mg/dL
Leukocytes,Ua: NEGATIVE
Nitrite: NEGATIVE
Protein, ur: NEGATIVE mg/dL
Specific Gravity, Urine: 1.025 (ref 1.005–1.030)
Urobilinogen, UA: 0.2 mg/dL (ref 0.0–1.0)
pH: 7 (ref 5.0–8.0)

## 2020-10-06 MED ORDER — POLYETHYLENE GLYCOL 3350 17 G PO PACK: 17.0000 g | PACK | Freq: Every day | ORAL | 0 refills | Status: DC | PRN

## 2020-10-06 MED ORDER — FLEET ENEMA 7-19 GM/118ML RE ENEM: 1.0000 | ENEMA | Freq: Every day | RECTAL | 1 refills | Status: DC | PRN

## 2020-10-06 MED ORDER — DOCUSATE SODIUM 100 MG PO CAPS: 100.0000 mg | ORAL_CAPSULE | Freq: Two times a day (BID) | ORAL | 0 refills | Status: DC

## 2020-10-06 NOTE — ED Triage Notes (Signed)
Pt in with c/o bilateral lower abdominal pain that started today. States that the pain also radiates up her right side. Also c/o nausea  Pt has not had medication for sxs  Has not had BM this week  Denies vomiting or feeling bloated

## 2020-10-06 NOTE — Discharge Instructions (Signed)
For moderate to severe constipation (not having a bowel movement in more than 3 days) then try to use Miralax or an enema once daily until you have a good bowel movement.  It is not a good idea to use laxatives regularly, for instance daily.  A medication you could use daily to help with promoting bowel movements is docusate (Colace) 50mg-100mg. It is okay to use this 1-2 times daily as needed as a stool softener.  Try to stay active physically including regular exercise 2-3 times a week.  Make sure you hydrate well every day with about 64 ounces of water daily (that is 2 liters).  Try to avoid carb heavy foods, dairy. This includes cutting out breads, pasta, pizza, pastries, potatoes, rice, starchy foods in general. Eat more fiber as listed below:  Salads - kale, spinach, cabbage, spring mix; use seeds like pumpkin seeds or sunflower seeds, almonds; you can also use 1-2 hard boiled eggs in your salads Fruits - avocadoes, berries (blueberries, raspberries, blackberries), apples, oranges, pomegranate, grapefruit Vegetables - aspargus, cauliflower, broccoli, green beans, brussel spouts, bell peppers; stay away from starchy vegetables like potatoes, carrots, peas  Do not eat any foods on this list that you are allergic to.  

## 2020-10-06 NOTE — ED Provider Notes (Signed)
[facility]   MRN: [id number] DOB: 03/27/1996  Subjective:   Stephanie Vazquez is a 24 y.o. female presenting for 1 day history of bilateral lower abdominal pain. Sx radiate along right side at times. Has had some intermittent nausea without vomiting. Has a bowel movement 2-3 times per week, has to strain at times. Has not had a bowel movement this week. Has not had any abdominal surgeries. She is passing gas still. Diet is mostly chicken, chicken tenders, fries. Does not eat much fiber.   No current facility-administered medications for this encounter.  Current Outpatient Medications:  .  fluticasone (FLONASE) 50 MCG/ACT nasal spray, Place 1-2 sprays into both nostrils daily for 7 days., Disp: 1 g, Rfl: 0 .  ibuprofen (ADVIL) 800 MG tablet, Take 1 tablet (800 mg total) by mouth 3 (three) times daily., Disp: 21 tablet, Rfl: 0 .  levonorgestrel (KYLEENA) 19.5 MG IUD, Kyleena 17.5 mcg/24 hrs (18yrs) 19.5mg  intrauterine device  Take 1 device by intrauterine route., Disp: , Rfl:    Allergies  Allergen Reactions  . No Known Allergies   . Other     Seasonal allergies    Past Medical History:  Diagnosis Date  . Allergy    seasonal     Past Surgical History:  Procedure Laterality Date  . NO PAST SURGERIES      Family History  Problem Relation Age of Onset  . Healthy Mother   . Healthy Father     Social History   Tobacco Use  . Smoking status: Never Smoker  . Smokeless tobacco: Never Used  Vaping Use  . Vaping Use: Never used  Substance Use Topics  . Alcohol use: Yes    Comment: occasionally  . Drug use: No    ROS   Objective:   Vitals: BP 132/68 (BP Location: Right Arm)   Pulse (!) 108   Resp 19   LMP 08/29/2020 (Approximate)   SpO2 100%   Physical Exam Constitutional:      General: She is not in acute distress.    Appearance: Normal appearance. She is well-developed and normal weight. She is not ill-appearing, toxic-appearing or diaphoretic.   HENT:     Head: Normocephalic and atraumatic.     Right Ear: External ear normal.     Left Ear: External ear normal.     Nose: Nose normal.     Mouth/Throat:     Mouth: Mucous membranes are moist.     Pharynx: Oropharynx is clear.  Eyes:     General: No scleral icterus.    Extraocular Movements: Extraocular movements intact.     Pupils: Pupils are equal, round, and reactive to light.  Cardiovascular:     Rate and Rhythm: Normal rate and regular rhythm.     Pulses: Normal pulses.     Heart sounds: Normal heart sounds. No murmur heard.  No friction rub. No gallop.   Pulmonary:     Effort: Pulmonary effort is normal. No respiratory distress.     Breath sounds: Normal breath sounds. No stridor. No wheezing, rhonchi or rales.  Abdominal:     General: Bowel sounds are normal. There is no distension.     Palpations: Abdomen is soft. There is no mass.     Tenderness: There is generalized abdominal tenderness and tenderness in the right lower quadrant and left lower quadrant. There is no right CVA tenderness, left CVA tenderness, guarding or rebound.  Skin:    General: Skin  is warm and dry.     Coloration: Skin is not pale.     Findings: No rash.  Neurological:     General: No focal deficit present.     Mental Status: She is alert and oriented to person, place, and time.  Psychiatric:        Mood and Affect: Mood normal.        Behavior: Behavior normal.        Thought Content: Thought content normal.        Judgment: Judgment normal.     Results for orders placed or performed during the hospital encounter of 10/06/20 (from the past 24 hour(s))  POCT Urinalysis Dipstick (ED/UC)     Status: Abnormal   Collection Time: 10/06/20  3:25 PM  Result Value Ref Range   Glucose, UA NEGATIVE NEGATIVE mg/dL   Bilirubin Urine NEGATIVE NEGATIVE   Ketones, ur NEGATIVE NEGATIVE mg/dL   Specific Gravity, Urine 1.025 1.005 - 1.030   Hgb urine dipstick SMALL (A) NEGATIVE   pH 7.0 5.0 - 8.0    Protein, ur NEGATIVE NEGATIVE mg/dL   Urobilinogen, UA 0.2 0.0 - 1.0 mg/dL   Nitrite NEGATIVE NEGATIVE   Leukocytes,Ua NEGATIVE NEGATIVE  POC urine preg, ED (not at [facility])     Status: None   Collection Time: 10/06/20  3:38 PM  Result Value Ref Range   Preg Test, Ur NEGATIVE NEGATIVE    Assessment and Plan :   PDMP not reviewed this encounter.  1. Generalized abdominal pain   2. Constipation, unspecified constipation type     Recommended significant dietary modifications. Start docusate daily. Use Miralax or enema for having a bowel movement in the next 1-2 days. Use supportive care otherwise. Counseled patient on potential for adverse effects with medications prescribed/recommended today, ER and return-to-clinic precautions discussed, patient verbalized understanding.    Wallis Bamberg, [place] 10/06/20 [phone]

## 2022-01-20 ENCOUNTER — Other Ambulatory Visit: Payer: Self-pay

## 2022-01-20 ENCOUNTER — Ambulatory Visit (HOSPITAL_COMMUNITY)
Admission: EM | Admit: 2022-01-20 | Discharge: 2022-01-20 | Disposition: A | Discharge status: Home / Self Care | Payer: Medicaid Other | Attending: Physician Assistant | Admitting: Physician Assistant

## 2022-01-20 ENCOUNTER — Encounter (HOSPITAL_COMMUNITY): Payer: Self-pay

## 2022-01-20 DIAGNOSIS — H1031 Unspecified acute conjunctivitis, right eye: Secondary | ICD-10-CM

## 2022-01-20 MED ORDER — ERYTHROMYCIN 5 MG/GM OP OINT: TOPICAL_OINTMENT | OPHTHALMIC | 0 refills | Status: DC

## 2022-01-20 NOTE — Discharge Instructions (Signed)
I believe that you have an infection of your eye.  Please use over the mycin ointment twice daily.  Wash her hands prior to handling medication and avoid touching the tip of the medication bottle to your eye to prevent contamination.  You can use lubricating eyedrops for additional symptom relief.  If at any point anything worsens and you have vision loss, severe pain, headache, dizziness, nausea, vomiting, fever you should be seen immediately.  If symptoms or not improving please follow-up with ophthalmologist as discussed; call to schedule an appointment.

## 2022-01-20 NOTE — ED Triage Notes (Signed)
Pt states that the 2 days she has pain in her right eye when she blinks and pressure when she holds her head down. Pt does report that her eye is tearing a lot. Denies injury or FB.

## 2022-01-20 NOTE — ED Provider Notes (Addendum)
[place]    CSN: [id number] Arrival date & time: 01/20/22  1312      History   Chief Complaint Chief Complaint  Patient presents with   Eye Pain    HPI Stephanie Vazquez is a 26 y.o. female.   Patient presents today with a 2-day history of irritation and drainage from her right eye.  Reports that she had artificial eyelashes applied and approximately 1 day after this developed irritation.  She denies any ocular pain or foreign body sensation.  Reports she has been getting her eyelashes at the same place for many months denies any new product exposure.  She denies any exposure to find particulate matter or additional chemicals.  She has not tried any over-the-counter medication for symptom management.  She denies any ocular injury.  She does not wear glasses or contacts.  She reports significant drainage particularly overnight making it difficult to open her eye in the morning.  Reports URI approximately 1 week ago but the symptoms have since resolved; denies any fever, nasal congestion, cough.   Past Medical History:  Diagnosis Date   Allergy    seasonal    Patient Active Problem List   Diagnosis Date Noted   MDD (major depressive disorder), recurrent episode, moderate ([place]) 10/06/2013   ODD (oppositional defiant disorder) 10/06/2013    Past Surgical History:  Procedure Laterality Date   NO PAST SURGERIES      OB History   No obstetric history on file.      Home Medications    Prior to Admission medications   Medication Sig Start Date End Date Taking? Authorizing Provider  erythromycin ophthalmic ointment Place a 1/2 inch ribbon of ointment into the lower eyelid of right eye twice daily for 7 days 01/20/22  Yes Riki Gehring, [place] Skill, PA-C  levonorgestrel (KYLEENA) 19.5 MG IUD Kyleena 17.5 mcg/24 hrs (97yrs) 19.5mg  intrauterine device  Take 1 device by intrauterine route.    [provider]  mirtazapine (REMERON) 15 MG tablet Take 1 tablet (15 mg total)  by mouth at bedtime and may repeat dose one time if needed. Patient not taking: Reported on 01/10/2017 10/12/13 06/14/20  Delight Hoh, MD    Family History Family History  Problem Relation Age of Onset   Healthy Mother    Healthy Father     Social History Social History   Tobacco Use   Smoking status: Never   Smokeless tobacco: Never  Vaping Use   Vaping Use: Never used  Substance Use Topics   Alcohol use: Yes    Comment: occasionally   Drug use: No     Allergies   No known allergies and Other   Review of Systems Review of Systems  Constitutional:  Negative for activity change, appetite change, fatigue and fever.  Eyes:  Positive for discharge, redness and itching. Negative for photophobia, pain and visual disturbance.  Respiratory:  Negative for cough and shortness of breath.   Cardiovascular:  Negative for chest pain.  Gastrointestinal:  Negative for abdominal pain, diarrhea, nausea and vomiting.  Neurological:  Negative for dizziness, light-headedness and headaches.    Physical Exam Triage Vital Signs ED Triage Vitals  Enc Vitals Group     BP 01/20/22 1507 (!) 135/93     Pulse Rate 01/20/22 1507 76     Resp --      Temp 01/20/22 1507 98 F (36.7 C)     Temp Source 01/20/22 1507 Oral     SpO2  01/20/22 1507 99 %     Weight --      Height --      Head Circumference --      Peak Flow --      Pain Score 01/20/22 1508 2     Pain Loc --      Pain Edu? --      Excl. in [place]? --    No data found.  Updated Vital Signs BP (!) 135/93 (BP Location: Right Arm)    Pulse 76    Temp 98 F (36.7 C) (Oral)    LMP 01/08/2022    SpO2 99%   Visual Acuity Right Eye Distance: 20/50 Left Eye Distance: 20/50 Bilateral Distance: 20/25  Right Eye Near:   Left Eye Near:    Bilateral Near:     Physical Exam Vitals reviewed.  Constitutional:      General: She is awake. She is not in acute distress.    Appearance: Normal appearance. She is well-developed. She is  not ill-appearing.     Comments: Very pleasant female appears stated age no acute distress sitting comfortably in exam room  HENT:     Head: Normocephalic and atraumatic.  Eyes:     Extraocular Movements: Extraocular movements intact.     Conjunctiva/sclera:     Right eye: Right conjunctiva is injected.     Left eye: Left conjunctiva is not injected.     Pupils: Pupils are equal, round, and reactive to light.  Cardiovascular:     Rate and Rhythm: Normal rate and regular rhythm.     Heart sounds: Normal heart sounds, S1 normal and S2 normal. No murmur heard. Pulmonary:     Effort: Pulmonary effort is normal.     Breath sounds: Normal breath sounds. No wheezing, rhonchi or rales.     Comments: Clear to auscultation bilaterally Abdominal:     Palpations: Abdomen is soft.     Tenderness: There is no abdominal tenderness.  Psychiatric:        Behavior: Behavior is cooperative.     UC Treatments / Results  Labs (all labs ordered are listed, but only abnormal results are displayed) Labs Reviewed - No data to display  EKG   Radiology No results found.  Procedures Procedures (including critical care time)  Medications Ordered in UC Medications - No data to display  Initial Impression / Assessment and Plan / UC Course  I have reviewed the triage vital signs and the nursing notes.  Pertinent labs & imaging results that were available during my care of the patient were reviewed by me and considered in my medical decision making (see chart for details).     Fluorescein staining was deferred given patient denies any foreign body sensation, ocular pain, recent trauma.  Discussed symptoms are consistent with conjunctivitis likely related to exposure at recent eyelash extension appointment.  Will cover with erythromycin twice daily for 7 days.  Discussed that this medication can bother vision for the first 15 to 20 minutes after application to thick consistency and recommended she  plan doses accordingly so as to avoid driving immediately after medication application.  She can use lubricating eyedrops for additional symptom relief.  She does not wear glasses or contacts.  Discussed that if symptoms or not improving she should follow-up with an ophthalmologist and was given contact information for local provider.  Discussed alarm symptoms that warrant emergent evaluation including ocular pain, visual change, fever, nausea/vomiting.  Strict return precautions given to which  she expressed understanding.  Work excuse note provided.  Final Clinical Impressions(s) / UC Diagnoses   Final diagnoses:  Acute bacterial conjunctivitis of right eye     Discharge Instructions      I believe that you have an infection of your eye.  Please use over the mycin ointment twice daily.  Wash her hands prior to handling medication and avoid touching the tip of the medication bottle to your eye to prevent contamination.  You can use lubricating eyedrops for additional symptom relief.  If at any point anything worsens and you have vision loss, severe pain, headache, dizziness, nausea, vomiting, fever you should be seen immediately.  If symptoms or not improving please follow-up with ophthalmologist as discussed; call to schedule an appointment.     ED Prescriptions     Medication Sig Dispense Auth. Provider   erythromycin ophthalmic ointment Place a 1/2 inch ribbon of ointment into the lower eyelid of right eye twice daily for 7 days 3.5 g Ladasha Schnackenberg K, PA-C      PDMP not reviewed this encounter.   Terrilee Croak, PA-C 01/20/22 1526    Amilliana Hayworth, Derry Skill, PA-C 01/20/22 1542

## 2022-02-28 ENCOUNTER — Encounter (HOSPITAL_COMMUNITY): Payer: Self-pay

## 2022-02-28 ENCOUNTER — Ambulatory Visit (HOSPITAL_COMMUNITY)
Admission: EM | Admit: 2022-02-28 | Discharge: 2022-02-28 | Disposition: A | Discharge status: Home / Self Care | Payer: Medicaid Other

## 2022-02-28 DIAGNOSIS — H1013 Acute atopic conjunctivitis, bilateral: Secondary | ICD-10-CM

## 2022-02-28 NOTE — ED Triage Notes (Signed)
Last night Pt reports that she "popped" a vessel in her left eye after sneezing. Pt notes some eye pain when she looks a certain way. Reports some drainage. No decrease in visual acuity. ?

## 2022-02-28 NOTE — Discharge Instructions (Addendum)
As we discussed, your eye redness is from your allergies.  Continue to take your allergy medications as previously prescribed.  It does not appear that you have an infection in your eyes.  You can obtain Pataday allergy eyedrops over-the-counter at the pharmacy to use.  Use this twice daily.  Keeping it in the refrigerator can make it feel better when you use the eyedrops.  Do not use eyedrops that states that they remove redness from the eyes.  If you have significant worsening of your symptoms especially if you develop pain or difficulty seeing, you should be seen by medical provider right away. ?

## 2022-02-28 NOTE — ED Provider Notes (Signed)
?[facility] ? ? ? ?CSN: [id number] ?Arrival date & time: 02/28/22  1113 ? ? ?  ? ?History   ?Chief Complaint ?Chief Complaint  ?Patient presents with  ? Eye Problem  ?  right  ? ? ?HPI ?Stephanie Vazquez is a 26 y.o. female.  ? ?Eye Redness ?Has bad allergies that are flaring right now ?Takes medication for this ?Eyes have been getting red and watering for the last few days ?Yesterday at work her boss told her it looks like she "popped a blood vessel in her eye" and she wanted to make sure this was okay ?Eyes feel irritated, but no pain or trauma ?Reports that vision has been normal, denies changes in vision ?Other than her allergies causing runny nose, she is in usual state of health ? ? ?Past Medical History:  ?Diagnosis Date  ? Allergy   ? seasonal  ? ? ?Patient Active Problem List  ? Diagnosis Date Noted  ? MDD (major depressive disorder), recurrent episode, moderate (HCC) 10/06/2013  ? ODD (oppositional defiant disorder) 10/06/2013  ? ? ?Past Surgical History:  ?Procedure Laterality Date  ? NO PAST SURGERIES    ? ? ?OB History   ?No obstetric history on file. ?  ? ? ? ?Home Medications   ? ?Prior to Admission medications   ?Medication Sig Start Date End Date Taking? Authorizing Provider  ?erythromycin ophthalmic ointment Place a 1/2 inch ribbon of ointment into the lower eyelid of right eye twice daily for 7 days 01/20/22   Raspet, Noberto Retort, PA-C  ?levonorgestrel (KYLEENA) 19.5 MG IUD Kyleena 17.5 mcg/24 hrs (3yrs) 19.5mg  intrauterine device ? Take 1 device by intrauterine route.    [provider]  ?mirtazapine (REMERON) 15 MG tablet Take 1 tablet (15 mg total) by mouth at bedtime and may repeat dose one time if needed. ?Patient not taking: Reported on 01/10/2017 10/12/13 06/14/20  Chauncey Mann, MD  ? ? ?Family History ?Family History  ?Problem Relation Age of Onset  ? Healthy Mother   ? Healthy Father   ? ? ?Social History ?Social History  ? ?Tobacco Use  ? Smoking status: Never  ? Smokeless  tobacco: Never  ?Vaping Use  ? Vaping Use: Never used  ?Substance Use Topics  ? Alcohol use: Yes  ?  Comment: occasionally  ? Drug use: No  ? ? ? ?Allergies   ?No known allergies and Other ? ? ?Review of Systems ?Review of Systems  ?All other systems reviewed and are negative. ?Per HPI ? ?Physical Exam ?Triage Vital Signs ?ED Triage Vitals  ?Enc Vitals Group  ?   BP   ?   Pulse   ?   Resp   ?   Temp   ?   Temp src   ?   SpO2   ?   Weight   ?   Height   ?   Head Circumference   ?   Peak Flow   ?   Pain Score   ?   Pain Loc   ?   Pain Edu?   ?   Excl. in GC?   ? ?No data found. ? ?Updated Vital Signs ?BP 122/84 (BP Location: Right Arm)   Pulse 80   Temp 98.3 ?F (36.8 ?C) (Oral)   Resp 18   SpO2 99%  ? ?Visual Acuity ?Right Eye Distance:   ?Left Eye Distance:   ?Bilateral Distance:   ? ?Right Eye Near:   ?Left Eye  Near:    ?Bilateral Near:    ? ?Physical Exam ?Constitutional:   ?   General: She is not in acute distress. ?   Appearance: She is well-developed. She is not ill-appearing or toxic-appearing.  ?HENT:  ?   Head: Normocephalic and atraumatic.  ?   Nose: Nose normal. No congestion or rhinorrhea.  ?   Mouth/Throat:  ?   Mouth: Mucous membranes are moist.  ?   Pharynx: No oropharyngeal exudate or posterior oropharyngeal erythema.  ?Eyes:  ?   Extraocular Movements: Extraocular movements intact.  ?   Pupils: Pupils are equal, round, and reactive to light.  ?   Comments: Mildly erythematous conjunctiva bilaterally with clear tearing noted, no purulent discharge, no subconjunctival hemorrhage noted  ?Cardiovascular:  ?   Rate and Rhythm: Normal rate.  ?Pulmonary:  ?   Effort: Pulmonary effort is normal. No respiratory distress.  ?   Breath sounds: Normal breath sounds.  ?Musculoskeletal:  ?   Cervical back: Neck supple. No rigidity.  ?Skin: ?   General: Skin is warm and dry.  ?   Capillary Refill: Capillary refill takes less than 2 seconds.  ?Neurological:  ?   Mental Status: She is alert and oriented to person,  place, and time.  ?Psychiatric:     ?   Mood and Affect: Mood normal.     ?   Behavior: Behavior normal.  ? ? ? ?UC Treatments / Results  ?Labs ?(all labs ordered are listed, but only abnormal results are displayed) ?Labs Reviewed - No data to display ? ?EKG ? ? ?Radiology ?No results found. ? ?Procedures ?Procedures (including critical care time) ? ?Medications Ordered in UC ?Medications - No data to display ? ?Initial Impression / Assessment and Plan / UC Course  ?I have reviewed the triage vital signs and the nursing notes. ? ?Pertinent labs & imaging results that were available during my care of the patient were reviewed by me and considered in my medical decision making (see chart for details). ? ?  ? ?Findings most consistent with viral conjunctivitis.  Reassured patient that she does not have a some conjunctival hemorrhage, but even if she did there would not be any treatment needed.  Reassured that she does not have any changes in vision and extraocular movements are intact.  Recommend over-the-counter allergy eye relief drops treatment and continue to treat her seasonal allergies.  Given return precautions, see AVS. ? ? ?Final Clinical Impressions(s) / UC Diagnoses  ? ?Final diagnoses:  ?Allergic conjunctivitis of both eyes  ? ? ? ?Discharge Instructions   ? ?  ?As we discussed, your eye redness is from your allergies.  Continue to take your allergy medications as previously prescribed.  It does not appear that you have an infection in your eyes.  You can obtain Pataday allergy eyedrops over-the-counter at the pharmacy to use.  Use this twice daily.  Keeping it in the refrigerator can make it feel better when you use the eyedrops.  Do not use eyedrops that states that they remove redness from the eyes.  If you have significant worsening of your symptoms especially if you develop pain or difficulty seeing, you should be seen by medical provider right away. ? ? ? ? ?ED Prescriptions   ?None ?  ? ?PDMP not  reviewed this encounter. ?  ?Unknown Jim, DO ?02/28/22 1317 ? ?

## 2022-04-13 ENCOUNTER — Ambulatory Visit (HOSPITAL_COMMUNITY)
Admission: EM | Admit: 2022-04-13 | Discharge: 2022-04-13 | Disposition: A | Discharge status: Home / Self Care | Payer: Self-pay | Attending: Physician Assistant | Admitting: Physician Assistant

## 2022-04-13 ENCOUNTER — Encounter (HOSPITAL_COMMUNITY): Payer: Self-pay

## 2022-04-13 DIAGNOSIS — J029 Acute pharyngitis, unspecified: Secondary | ICD-10-CM | POA: Insufficient documentation

## 2022-04-13 LAB — POCT INFECTIOUS MONO SCREEN, ED / UC: Mono Screen: NEGATIVE

## 2022-04-13 LAB — POCT RAPID STREP A, ED / UC: Streptococcus, Group A Screen (Direct): NEGATIVE

## 2022-04-13 MED ORDER — AMOXICILLIN 500 MG PO TABS: 500.0000 mg | ORAL_TABLET | Freq: Two times a day (BID) | ORAL | 0 refills | Status: AC

## 2022-04-13 NOTE — ED Provider Notes (Signed)
[facility]    CSN: [id number] Arrival date & time: 04/13/22  1755      History   Chief Complaint Chief Complaint  Patient presents with   throat pain    HPI Stephanie Vazquez is a 26 y.o. female.   Pt complains of sore throat.  Reports she has been experiencing intermittent sore throat for about three weeks, but the sx became much worse over the last few days.  She denies congestion, cough, fever, chills. She has been gargling with salt water with temporary relief.  Eating and drinking makes the pain worse.    Past Medical History:  Diagnosis Date   Allergy    seasonal    Patient Active Problem List   Diagnosis Date Noted   MDD (major depressive disorder), recurrent episode, moderate ([place]) 10/06/2013   ODD (oppositional defiant disorder) 10/06/2013    Past Surgical History:  Procedure Laterality Date   NO PAST SURGERIES      OB History   No obstetric history on file.      Home Medications    Prior to Admission medications   Medication Sig Start Date End Date Taking? Authorizing Provider  amoxicillin (AMOXIL) 500 MG tablet Take 1 tablet (500 mg total) by mouth 2 (two) times daily for 10 days. 04/13/22 04/23/22 Yes Ward, Lenise Arena, PA-C  erythromycin ophthalmic ointment Place a 1/2 inch ribbon of ointment into the lower eyelid of right eye twice daily for 7 days 01/20/22   Raspet, Derry Skill, PA-C  levonorgestrel (KYLEENA) 19.5 MG IUD Kyleena 17.5 mcg/24 hrs (78yrs) 19.5mg  intrauterine device  Take 1 device by intrauterine route.    [provider]  mirtazapine (REMERON) 15 MG tablet Take 1 tablet (15 mg total) by mouth at bedtime and may repeat dose one time if needed. Patient not taking: Reported on 01/10/2017 10/12/13 06/14/20  Delight Hoh, MD    Family History Family History  Problem Relation Age of Onset   Healthy Mother    Healthy Father     Social History Social History   Tobacco Use   Smoking status: Never   Smokeless tobacco:  Never  Vaping Use   Vaping Use: Never used  Substance Use Topics   Alcohol use: Yes    Comment: occasionally   Drug use: No     Allergies   No known allergies and Other   Review of Systems Review of Systems  Constitutional:  Negative for chills and fever.  HENT:  Positive for sore throat. Negative for ear pain.   Eyes:  Negative for pain and visual disturbance.  Respiratory:  Negative for cough and shortness of breath.   Cardiovascular:  Negative for chest pain and palpitations.  Gastrointestinal:  Negative for abdominal pain and vomiting.  Genitourinary:  Negative for dysuria and hematuria.  Musculoskeletal:  Negative for arthralgias and back pain.  Skin:  Negative for color change and rash.  Neurological:  Negative for seizures and syncope.  All other systems reviewed and are negative.   Physical Exam Triage Vital Signs ED Triage Vitals  Enc Vitals Group     BP 04/13/22 1823 130/69     Pulse Rate 04/13/22 1823 90     Resp 04/13/22 1823 18     Temp 04/13/22 1823 98.5 F (36.9 C)     Temp Source 04/13/22 1823 Oral     SpO2 04/13/22 1823 99 %     Weight --      Height --  Head Circumference --      Peak Flow --      Pain Score 04/13/22 1822 8     Pain Loc --      Pain Edu? --      Excl. in [place]? --    No data found.  Updated Vital Signs BP 130/69 (BP Location: Left Arm)   Pulse 90   Temp 98.5 F (36.9 C) (Oral)   Resp 18   SpO2 99%   Visual Acuity Right Eye Distance:   Left Eye Distance:   Bilateral Distance:    Right Eye Near:   Left Eye Near:    Bilateral Near:     Physical Exam Vitals and nursing note reviewed.  Constitutional:      General: She is not in acute distress.    Appearance: She is well-developed.  HENT:     Head: Normocephalic and atraumatic.     Mouth/Throat:     Pharynx: Posterior oropharyngeal erythema present.     Tonsils: Tonsillar exudate present. 1+ on the right. 1+ on the left.  Eyes:     Conjunctiva/sclera:  Conjunctivae normal.  Cardiovascular:     Rate and Rhythm: Normal rate and regular rhythm.     Heart sounds: No murmur heard. Pulmonary:     Effort: Pulmonary effort is normal. No respiratory distress.     Breath sounds: Normal breath sounds.  Abdominal:     Palpations: Abdomen is soft.     Tenderness: There is no abdominal tenderness.  Musculoskeletal:        General: No swelling.     Cervical back: Neck supple.  Skin:    General: Skin is warm and dry.     Capillary Refill: Capillary refill takes less than 2 seconds.  Neurological:     Mental Status: She is alert.  Psychiatric:        Mood and Affect: Mood normal.     UC Treatments / Results  Labs (all labs ordered are listed, but only abnormal results are displayed) Labs Reviewed  POCT RAPID STREP A, ED / UC  POCT INFECTIOUS MONO SCREEN, ED / UC  CERVICOVAGINAL ANCILLARY ONLY    EKG   Radiology No results found.  Procedures Procedures (including critical care time)  Medications Ordered in UC Medications - No data to display  Initial Impression / Assessment and Plan / UC Course  I have reviewed the triage vital signs and the nursing notes.  Pertinent labs & imaging results that were available during my care of the patient were reviewed by me and considered in my medical decision making (see chart for details).     Will treat for strep with amoxicillin.  Lab results pending.  Will change treatment plan if indicated.  Supportive care discussed. Return precautions discussed.  Final Clinical Impressions(s) / UC Diagnoses   Final diagnoses:  Acute pharyngitis, unspecified etiology     Discharge Instructions      Take antibiotic as prescribed.  Will call with test results Continue with salt water gargles as needed Can take ibuprofen or tylenol as needed for pain    ED Prescriptions     Medication Sig Dispense Auth. Provider   amoxicillin (AMOXIL) 500 MG tablet Take 1 tablet (500 mg total) by mouth 2  (two) times daily for 10 days. 20 tablet Ward, Lenise Arena, PA-C      PDMP not reviewed this encounter.   Ward, Lenise Arena, PA-C 04/13/22 1923

## 2022-04-13 NOTE — ED Triage Notes (Signed)
2wk h/o intermittent throat pain that has worsened in the last few days. Notes pain w/speaking, eating and drinking. Has been gargling with salt water. No meds taken. No uri sxs.

## 2022-04-13 NOTE — Discharge Instructions (Signed)
Take antibiotic as prescribed.  Will call with test results Continue with salt water gargles as needed Can take ibuprofen or tylenol as needed for pain

## 2022-04-16 LAB — CERVICOVAGINAL ANCILLARY ONLY
Chlamydia: NEGATIVE
Comment: NEGATIVE
Comment: NORMAL
Neisseria Gonorrhea: NEGATIVE

## 2022-05-15 ENCOUNTER — Other Ambulatory Visit: Payer: Self-pay

## 2022-05-15 ENCOUNTER — Encounter (HOSPITAL_COMMUNITY): Payer: Self-pay | Admitting: *Deleted

## 2022-05-15 ENCOUNTER — Ambulatory Visit (HOSPITAL_COMMUNITY)
Admission: EM | Admit: 2022-05-15 | Discharge: 2022-05-15 | Disposition: A | Discharge status: Home / Self Care | Payer: Medicaid Other | Attending: Physician Assistant | Admitting: Physician Assistant

## 2022-05-15 DIAGNOSIS — R21 Rash and other nonspecific skin eruption: Secondary | ICD-10-CM | POA: Insufficient documentation

## 2022-05-15 DIAGNOSIS — Z202 Contact with and (suspected) exposure to infections with a predominantly sexual mode of transmission: Secondary | ICD-10-CM | POA: Insufficient documentation

## 2022-05-15 LAB — HIV ANTIBODY (ROUTINE TESTING W REFLEX): HIV Screen 4th Generation wRfx: NONREACTIVE

## 2022-05-15 NOTE — ED Triage Notes (Signed)
T reports a possible rash located in genital region that started the first this month.

## 2022-05-15 NOTE — ED Provider Notes (Signed)
[facility]    CSN: [id number] Arrival date & time: 05/15/22  1432      History   Chief Complaint Chief Complaint  Patient presents with   Rash    HPI Stephanie Vazquez is a 26 y.o. female.   26 year old female presents with rash in the genital area.  Patient relates that she had unprotected intercourse with her significant other 4 weeks ago, and then couple days afterwards he contacted her and informed her that he had been diagnosed with syphilis.  Patient indicates that over the past several days she has had a painful rash develop in the genital area, she relates she is not having any vaginal discharge.  Patient relates that she is not having any burning or pain with urination.  Patient relates she cannot visualize the the rash area very well but indicates it is painful and it does itch.  Patient relates that her last period was about a week and a half ago and it was normal.  Patient relates that she has an IUD in place.  Patient denies fever, chills, no nausea or vomiting.   Rash   Past Medical History:  Diagnosis Date   Allergy    seasonal    Patient Active Problem List   Diagnosis Date Noted   MDD (major depressive disorder), recurrent episode, moderate (HCC) 10/06/2013   ODD (oppositional defiant disorder) 10/06/2013    Past Surgical History:  Procedure Laterality Date   NO PAST SURGERIES      OB History   No obstetric history on file.      Home Medications    Prior to Admission medications   Medication Sig Start Date End Date Taking? Authorizing Provider  erythromycin ophthalmic ointment Place a 1/2 inch ribbon of ointment into the lower eyelid of right eye twice daily for 7 days 01/20/22   Raspet, Noberto Retort, PA-C  levonorgestrel (KYLEENA) 19.5 MG IUD Kyleena 17.5 mcg/24 hrs (50yrs) 19.5mg  intrauterine device  Take 1 device by intrauterine route.    [provider]  mirtazapine (REMERON) 15 MG tablet Take 1 tablet (15 mg total) by mouth at  bedtime and may repeat dose one time if needed. Patient not taking: Reported on 01/10/2017 10/12/13 06/14/20  Chauncey Mann, MD    Family History Family History  Problem Relation Age of Onset   Healthy Mother    Healthy Father     Social History Social History   Tobacco Use   Smoking status: Never   Smokeless tobacco: Never  Vaping Use   Vaping Use: Never used  Substance Use Topics   Alcohol use: Yes    Comment: occasionally   Drug use: No     Allergies   No known allergies and Other   Review of Systems Review of Systems  Skin:  Positive for rash (Left lower genital area).     Physical Exam Triage Vital Signs ED Triage Vitals  Enc Vitals Group     BP 05/15/22 1539 128/64     Pulse Rate 05/15/22 1539 74     Resp 05/15/22 1539 18     Temp 05/15/22 1539 98.2 F (36.8 C)     Temp src --      SpO2 05/15/22 1539 100 %     Weight --      Height --      Head Circumference --      Peak Flow --      Pain Score 05/15/22 1537 6  Pain Loc --      Pain Edu? --      Excl. in GC? --    No data found.  Updated Vital Signs BP 128/64   Pulse 74   Temp 98.2 F (36.8 C)   Resp 18   LMP 05/08/2022   SpO2 100%   Visual Acuity Right Eye Distance:   Left Eye Distance:   Bilateral Distance:    Right Eye Near:   Left Eye Near:    Bilateral Near:     Physical Exam Constitutional:      Appearance: Normal appearance.  Genitourinary:      Comments: Pelvic: There is 3 small grouped areas of inflammation and pustular formation at the lower part of the labia and minora.  This is painful on palpation, there is no active drainage, minimal redness present.  There are no other rash areas noted along the labia minora, labia majora, or adjacent tissue.  Viral culture for HSV testing has been collected. Neurological:     Mental Status: She is alert.      UC Treatments / Results  Labs (all labs ordered are listed, but only abnormal results are displayed) Labs  Reviewed  HSV CULTURE AND TYPING  RPR  HIV ANTIBODY (ROUTINE TESTING W REFLEX)  CERVICOVAGINAL ANCILLARY ONLY    EKG   Radiology No results found.  Procedures Procedures (including critical care time)  Medications Ordered in UC Medications - No data to display  Initial Impression / Assessment and Plan / UC Course  I have reviewed the triage vital signs and the nursing notes.  Pertinent labs & imaging results that were available during my care of the patient were reviewed by me and considered in my medical decision making (see chart for details).    Plan: 1.  HIV, RPR testing has been drawn and is pending. 2.  Cultures have been taking to test for GC, chlamydia, trichomonas. 3.  Viral culture for HSV has been taken from the wound sites and is pending. 4.  Patient is advised to await the results which will return in 48 hours or she can go to MyChart and review the findings.  She has been counseled if any results are positive she will be contacted by the medical staff. 5.  Advised to follow-up PCP or return if symptoms fail to improve. Final Clinical Impressions(s) / UC Diagnoses   Final diagnoses:  Rash  Exposure to sexually transmitted disease (STD)     Discharge Instructions      Advise Epsom salt soaks to help reduce the discomfort and irritation of the area. Advised to take ibuprofen or Tylenol as needed for pain and discomfort. Advised to check MyChart in the next 24 to 48 hours to review the testing results. Advised to follow-up PCP return to urgent care if symptoms fail to improve.     ED Prescriptions   None    PDMP not reviewed this encounter.   Ellsworth Lennox, PA-C 05/15/22 1638

## 2022-05-15 NOTE — Discharge Instructions (Addendum)
Advise Epsom salt soaks to help reduce the discomfort and irritation of the area. Advised to take ibuprofen or Tylenol as needed for pain and discomfort. Advised to check MyChart in the next 24 to 48 hours to review the testing results. Advised to follow-up PCP return to urgent care if symptoms fail to improve.

## 2022-05-16 LAB — CERVICOVAGINAL ANCILLARY ONLY
Bacterial Vaginitis (gardnerella): POSITIVE — AB
Candida Glabrata: NEGATIVE
Candida Vaginitis: POSITIVE — AB
Chlamydia: NEGATIVE
Comment: NEGATIVE
Comment: NEGATIVE
Comment: NEGATIVE
Comment: NEGATIVE
Comment: NEGATIVE
Comment: NORMAL
Neisseria Gonorrhea: NEGATIVE
Trichomonas: POSITIVE — AB

## 2022-05-16 LAB — RPR
RPR Ser Ql: REACTIVE — AB
RPR Titer: 1:32 {titer}

## 2022-05-17 LAB — T.PALLIDUM AB, TOTAL: T Pallidum Abs: REACTIVE — AB

## 2022-05-18 ENCOUNTER — Telehealth (HOSPITAL_COMMUNITY): Payer: Self-pay | Admitting: Emergency Medicine

## 2022-05-18 MED ORDER — METRONIDAZOLE 500 MG PO TABS: 500.0000 mg | ORAL_TABLET | Freq: Two times a day (BID) | ORAL | 0 refills | Status: DC

## 2022-05-18 MED ORDER — FLUCONAZOLE 150 MG PO TABS: 150.0000 mg | ORAL_TABLET | Freq: Once | ORAL | 0 refills | Status: AC

## 2022-05-22 ENCOUNTER — Ambulatory Visit (HOSPITAL_COMMUNITY)
Admission: EM | Admit: 2022-05-22 | Discharge: 2022-05-22 | Disposition: A | Discharge status: Home / Self Care | Payer: Medicaid Other | Attending: Family Medicine | Admitting: Family Medicine

## 2022-05-22 ENCOUNTER — Encounter (HOSPITAL_COMMUNITY): Payer: Self-pay

## 2022-05-22 DIAGNOSIS — A539 Syphilis, unspecified: Secondary | ICD-10-CM

## 2022-05-22 MED ORDER — PENICILLIN G BENZATHINE 1200000 UNIT/2ML IM SUSY
PREFILLED_SYRINGE | INTRAMUSCULAR | Status: AC
  Filled 2022-05-22: qty 2

## 2022-05-22 MED ORDER — PENICILLIN G BENZATHINE 1200000 UNIT/2ML IM SUSY
2.4000 10*6.[IU] | PREFILLED_SYRINGE | Freq: Once | INTRAMUSCULAR | Status: AC
  Administered 2022-05-22: 2.4 10*6.[IU] via INTRAMUSCULAR

## 2022-05-22 NOTE — ED Triage Notes (Signed)
Pt here for follow up. Called back in for pcn injection.

## 2022-05-24 ENCOUNTER — Ambulatory Visit (HOSPITAL_COMMUNITY)
Admission: EM | Admit: 2022-05-24 | Discharge: 2022-05-24 | Disposition: A | Discharge status: Home / Self Care | Payer: Medicaid Other | Attending: Internal Medicine | Admitting: Internal Medicine

## 2022-05-24 ENCOUNTER — Encounter (HOSPITAL_COMMUNITY): Payer: Self-pay | Admitting: Emergency Medicine

## 2022-05-24 DIAGNOSIS — L299 Pruritus, unspecified: Secondary | ICD-10-CM

## 2022-05-24 MED ORDER — HYDROXYZINE HCL 25 MG PO TABS: 25.0000 mg | ORAL_TABLET | Freq: Three times a day (TID) | ORAL | 0 refills | Status: DC | PRN

## 2022-05-24 NOTE — Discharge Instructions (Signed)
Take medications as prescribed Please use mild soap and hypoallergenic cosmetics/detergents If symptoms persist please return to urgent care to be reevaluated.

## 2022-05-24 NOTE — ED Triage Notes (Signed)
Patient c/o itching x 1 week.   Patient endorses generalized itching and irritation "all over".   Patient endorses itching has progressively worsened.   Patient denies any rash.   Patient endorses using a " new brand of dove soap".   Patient hasn't used any medications for symptoms.

## 2022-05-30 NOTE — ED Provider Notes (Signed)
[facility]    CSN: [id number] Arrival date & time: 05/24/22  1115      History   Chief Complaint Chief Complaint  Patient presents with   Pruritis    HPI Stephanie Vazquez is a 26 y.o. female comes to the urgent care with a 1 week history of generalized itching of 1 week duration.  She denies any fever or chills.  No shortness of breath or wheezing.  Patient started using a new brand of Dove soap about a week or so ago.  No hives or rash.  No known relieving factors.  No eye redness, itching or tearing of her eye.   HPI  Past Medical History:  Diagnosis Date   Allergy    seasonal    Patient Active Problem List   Diagnosis Date Noted   MDD (major depressive disorder), recurrent episode, moderate ([facility]CC) 10/06/2013   ODD (oppositional defiant disorder) 10/06/2013    Past Surgical History:  Procedure Laterality Date   NO PAST SURGERIES      OB History   No obstetric history on file.      Home Medications    Prior to Admission medications   Medication Sig Start Date End Date Taking? Authorizing Provider  hydrOXYzine (ATARAX) 25 MG tablet Take 1 tablet (25 mg total) by mouth every 8 (eight) hours as needed. 05/24/22  Yes Hollin Crewe, Britta Mccreedy, MD  metroNIDAZOLE (FLAGYL) 500 MG tablet Take 1 tablet (500 mg total) by mouth 2 (two) times daily. 05/18/22  Yes Arlo Buffone, Britta Mccreedy, MD  levonorgestrel [facility]) 19.5 MG IUD Kyleena 17.5 mcg/24 hrs (82yrs) 19.5mg  intrauterine device  Take 1 device by intrauterine route.    [provider]  mirtazapine (REMERON) 15 MG tablet Take 1 tablet (15 mg total) by mouth at bedtime and may repeat dose one time if needed. Patient not taking: Reported on 01/10/2017 10/12/13 06/14/20  Chauncey Mann, MD    Family History Family History  Problem Relation Age of Onset   Healthy Mother    Healthy Father     Social History Social History   Tobacco Use   Smoking status: Never   Smokeless tobacco: Never  Vaping Use    Vaping Use: Never used  Substance Use Topics   Alcohol use: Yes    Comment: occasionally   Drug use: No     Allergies   Other   Review of Systems Review of Systems  Respiratory: Negative.    Cardiovascular: Negative.   Gastrointestinal: Negative.   Musculoskeletal: Negative.   Skin: Negative.      Physical Exam Triage Vital Signs ED Triage Vitals  Enc Vitals Group     BP 05/24/22 1139 120/82     Pulse Rate 05/24/22 1139 84     Resp 05/24/22 1139 16     Temp 05/24/22 1139 98.1 F (36.7 C)     Temp Source 05/24/22 1139 Oral     SpO2 05/24/22 1139 100 %     Weight --      Height --      Head Circumference --      Peak Flow --      Pain Score 05/24/22 1143 0     Pain Loc --      Pain Edu? --      Excl. in [place]? --    No data found.  Updated Vital Signs BP 120/82 (BP Location: Left Arm)   Pulse 84   Temp 98.1 F (36.7 C) (  Oral)   Resp 16   LMP 05/08/2022 (Approximate)   SpO2 100%   Visual Acuity Right Eye Distance:   Left Eye Distance:   Bilateral Distance:    Right Eye Near:   Left Eye Near:    Bilateral Near:     Physical Exam Vitals and nursing note reviewed.  Constitutional:      General: She is not in acute distress.    Appearance: She is not ill-appearing.  Cardiovascular:     Rate and Rhythm: Normal rate and regular rhythm.  Pulmonary:     Effort: Pulmonary effort is normal.     Breath sounds: Normal breath sounds.  Abdominal:     General: Bowel sounds are normal.     Palpations: Abdomen is soft.  Musculoskeletal:        General: Normal range of motion.  Skin:    General: Skin is warm.     Coloration: Skin is not jaundiced.     Findings: No bruising or erythema.  Neurological:     Mental Status: She is alert.      UC Treatments / Results  Labs (all labs ordered are listed, but only abnormal results are displayed) Labs Reviewed - No data to display  EKG   Radiology No results found.  Procedures Procedures (including  critical care time)  Medications Ordered in UC Medications - No data to display  Initial Impression / Assessment and Plan / UC Course  I have reviewed the triage vital signs and the nursing notes.  Pertinent labs & imaging results that were available during my care of the patient were reviewed by me and considered in my medical decision making (see chart for details).     1.  Pruritus: Patient is advised to stop using this new brand of soap Hydroxyzine as needed for itching Increase oral fluid intake Return to urgent care if you have any worsening symptoms. Final Clinical Impressions(s) / UC Diagnoses   Final diagnoses:  Pruritus     Discharge Instructions      Take medications as prescribed Please use mild soap and hypoallergenic cosmetics/detergents If symptoms persist please return to urgent care to be reevaluated.    ED Prescriptions     Medication Sig Dispense Auth. Provider   hydrOXYzine (ATARAX) 25 MG tablet Take 1 tablet (25 mg total) by mouth every 8 (eight) hours as needed. 20 tablet Donley Harland, Britta Mccreedy, MD      PDMP not reviewed this encounter.   Merrilee Jansky, MD 05/30/22 1725

## 2023-04-09 ENCOUNTER — Ambulatory Visit: Payer: 59 | Admitting: Family Medicine

## 2023-05-06 ENCOUNTER — Encounter (HOSPITAL_COMMUNITY): Payer: Self-pay

## 2023-05-06 ENCOUNTER — Ambulatory Visit (HOSPITAL_COMMUNITY)
Admission: EM | Admit: 2023-05-06 | Discharge: 2023-05-06 | Disposition: A | Discharge status: Home / Self Care | Payer: 59 | Attending: Physician Assistant | Admitting: Physician Assistant

## 2023-05-06 DIAGNOSIS — R110 Nausea: Secondary | ICD-10-CM | POA: Diagnosis not present

## 2023-05-06 DIAGNOSIS — R109 Unspecified abdominal pain: Secondary | ICD-10-CM | POA: Diagnosis not present

## 2023-05-06 LAB — POCT URINALYSIS DIP (MANUAL ENTRY)
Glucose, UA: NEGATIVE mg/dL
Ketones, POC UA: NEGATIVE mg/dL
Leukocytes, UA: NEGATIVE
Nitrite, UA: NEGATIVE
Protein Ur, POC: NEGATIVE mg/dL
Spec Grav, UA: >=1.03 — AB (ref 1.010–1.025)
Urobilinogen, UA: 0.2 E.U./dL
pH, UA: 5.5 (ref 5.0–8.0)

## 2023-05-06 LAB — POCT URINE PREGNANCY: Preg Test, Ur: NEGATIVE

## 2023-05-06 MED ORDER — DICYCLOMINE HCL 10 MG PO CAPS: 10.0000 mg | ORAL_CAPSULE | Freq: Three times a day (TID) | ORAL | 0 refills | Status: AC

## 2023-05-06 MED ORDER — ONDANSETRON 4 MG PO TBDP: 4.0000 mg | ORAL_TABLET | Freq: Three times a day (TID) | ORAL | 0 refills | Status: AC | PRN

## 2023-05-06 NOTE — ED Triage Notes (Signed)
Pt reports 2 days of abdominal pain and nausea. Denies any known STI. Pt is requesting pregnancy test.

## 2023-05-06 NOTE — Discharge Instructions (Addendum)
Your urine pregnancy was negative.  Your UA showed that you have not been drinking enough fluid so please make sure that you are pushing fluids.  Start dicyclomine before each meal before bed to help with abdominal pain and cramping.  I also recommend eating bland diet such as the brat diet (bananas, rice, applesauce, toast) and eating small frequent meals.  Avoid alcohol and NSAIDs (aspirin, ibuprofen/Advil, naproxen/Aleve).  You can use the Zofran for nausea symptoms.  If your symptoms or not improving by later in the week please return for reevaluation.  If anything worsens and you have severe abdominal pain, nausea, vomiting, blood in your stool, blood in your vomit you need to be seen immediately.

## 2023-05-06 NOTE — ED Provider Notes (Signed)
[facility]    CSN: [id number] Arrival date & time: 05/06/23  1407      History   Chief Complaint Chief Complaint  Patient presents with   Nausea   Abdominal Cramping    HPI Stephanie Vazquez is a 27 y.o. female.   Patient presents today with a 2-day history of abdominal pain and intermittent nausea.  She reports that pain is generalized throughout her abdomen but a little bit worse in her upper abdomen, rated 5 on a 0-10 pain scale, described as aching, no aggravating relieving factors notified.  She denies any association with oral intake.  She is sexually active with female partners and requesting a pregnancy test.  She denies history of gastrointestinal disorder including irritable bowel, inflammatory bowel, GERD.  She has not tried any over-the-counter medication for symptom management.  Denies any increased alcohol consumption.  Denies regular NSAID use.  She is having normal bowel movements with last bowel movement earlier today without blood or mucus.  She reports associated nausea but denies any vomiting or hematemesis.  Denies any fever, urinary symptoms, vaginal complaints.  Denies any suspicious food intake, known sick contacts, recent travel.    Past Medical History:  Diagnosis Date   Allergy    seasonal    Patient Active Problem List   Diagnosis Date Noted   MDD (major depressive disorder), recurrent episode, moderate (HCC) 10/06/2013   ODD (oppositional defiant disorder) 10/06/2013    Past Surgical History:  Procedure Laterality Date   NO PAST SURGERIES      OB History   No obstetric history on file.      Home Medications    Prior to Admission medications   Medication Sig Start Date End Date Taking? Authorizing Provider  dicyclomine (BENTYL) 10 MG capsule Take 1 capsule (10 mg total) by mouth 4 (four) times daily -  before meals and at bedtime. 05/06/23  Yes Jimmey Hengel K, PA-C  ondansetron (ZOFRAN-ODT) 4 MG disintegrating tablet Take 1 tablet  (4 mg total) by mouth every 8 (eight) hours as needed for nausea or vomiting. 05/06/23  Yes Sesar Madewell K, PA-C  mirtazapine (REMERON) 15 MG tablet Take 1 tablet (15 mg total) by mouth at bedtime and may repeat dose one time if needed. Patient not taking: Reported on 01/10/2017 10/12/13 06/14/20  Chauncey Mann, MD    Family History Family History  Problem Relation Age of Onset   Healthy Mother    Healthy Father     Social History Social History   Tobacco Use   Smoking status: Never   Smokeless tobacco: Never  Vaping Use   Vaping Use: Never used  Substance Use Topics   Alcohol use: Yes    Comment: occasionally   Drug use: No     Allergies   Other   Review of Systems Review of Systems  Constitutional:  Positive for activity change. Negative for appetite change, fatigue and fever.  Respiratory:  Negative for shortness of breath.   Cardiovascular:  Negative for chest pain.  Gastrointestinal:  Positive for abdominal pain. Negative for diarrhea, nausea and vomiting.  Genitourinary:  Negative for dysuria, frequency, urgency, vaginal bleeding, vaginal discharge and vaginal pain.     Physical Exam Triage Vital Signs ED Triage Vitals [05/06/23 1449]  Enc Vitals Group     BP 126/83     Pulse Rate 88     Resp 18     Temp 98.2 F (36.8 C)  Temp Source Oral     SpO2 98 %     Weight      Height      Head Circumference      Peak Flow      Pain Score      Pain Loc      Pain Edu?      Excl. in GC?    No data found.  Updated Vital Signs BP 126/83 (BP Location: Left Arm)   Pulse 88   Temp 98.2 F (36.8 C) (Oral)   Resp 18   SpO2 98%   Visual Acuity Right Eye Distance:   Left Eye Distance:   Bilateral Distance:    Right Eye Near:   Left Eye Near:    Bilateral Near:     Physical Exam Vitals reviewed.  Constitutional:      General: She is awake. She is not in acute distress.    Appearance: Normal appearance. She is well-developed. She is not  ill-appearing.     Comments: Very pleasant female appears stated age no acute distress sitting comfortable in exam room  HENT:     Head: Normocephalic and atraumatic.  Cardiovascular:     Rate and Rhythm: Normal rate and regular rhythm.     Heart sounds: Normal heart sounds, S1 normal and S2 normal. No murmur heard. Pulmonary:     Effort: Pulmonary effort is normal.     Breath sounds: Normal breath sounds. No wheezing, rhonchi or rales.     Comments: Clear to auscultation bilaterally Abdominal:     General: Bowel sounds are normal.     Palpations: Abdomen is soft.     Tenderness: There is generalized abdominal tenderness. There is no right CVA tenderness, left CVA tenderness, guarding or rebound.  Psychiatric:        Behavior: Behavior is cooperative.      UC Treatments / Results  Labs (all labs ordered are listed, but only abnormal results are displayed) Labs Reviewed  POCT URINALYSIS DIP (MANUAL ENTRY) - Abnormal; Notable for the following components:      Result Value   Bilirubin, UA small (*)    Spec Grav, UA >=1.030 (*)    Blood, UA moderate (*)    All other components within normal limits  POCT URINE PREGNANCY    EKG   Radiology No results found.  Procedures Procedures (including critical care time)  Medications Ordered in UC Medications - No data to display  Initial Impression / Assessment and Plan / UC Course  I have reviewed the triage vital signs and the nursing notes.  Pertinent labs & imaging results that were available during my care of the patient were reviewed by me and considered in my medical decision making (see chart for details).     Patient is well-appearing, afebrile, nontoxic, nontachycardic.  Vital signs and physical exam were reassuring with no indication for emergent evaluation or imaging.  Urine pregnancy was negative.  UA showed dehydration with associated blood.  Discussed that she should push fluids it would need to have this repeated  in a few weeks to ensure that the blood noted today resolves.  Encouraged her to follow-up with her primary care.  She was started on Zofran and dicyclomine to help with symptom relief.  Recommended that she eat a bland diet and push fluids.  Discussed that if her symptoms are not improving within a few days she should return for reevaluation.  If she has any worsening symptoms including severe  abdominal pain, melena, hematochezia, nausea, vomiting, fever she needs to be seen immediately.  Recommend she avoid alcohol and NSAIDs.  Strict return precautions were given.  Work excuse note provided.  Final Clinical Impressions(s) / UC Diagnoses   Final diagnoses:  Abdominal cramping  Nausea without vomiting     Discharge Instructions      Your urine pregnancy was negative.  Your UA showed that you have not been drinking enough fluid so please make sure that you are pushing fluids.  Start dicyclomine before each meal before bed to help with abdominal pain and cramping.  I also recommend eating bland diet such as the brat diet (bananas, rice, applesauce, toast) and eating small frequent meals.  Avoid alcohol and NSAIDs (aspirin, ibuprofen/Advil, naproxen/Aleve).  You can use the Zofran for nausea symptoms.  If your symptoms or not improving by later in the week please return for reevaluation.  If anything worsens and you have severe abdominal pain, nausea, vomiting, blood in your stool, blood in your vomit you need to be seen immediately.     ED Prescriptions     Medication Sig Dispense Auth. Provider   dicyclomine (BENTYL) 10 MG capsule Take 1 capsule (10 mg total) by mouth 4 (four) times daily -  before meals and at bedtime. 28 capsule Yuriel Lopezmartinez K, PA-C   ondansetron (ZOFRAN-ODT) 4 MG disintegrating tablet Take 1 tablet (4 mg total) by mouth every 8 (eight) hours as needed for nausea or vomiting. 20 tablet Caylei Sperry, Noberto Retort, PA-C      PDMP not reviewed this encounter.   Jeani Hawking,  PA-C 05/06/23 1514

## 2023-07-21 ENCOUNTER — Emergency Department (HOSPITAL_BASED_OUTPATIENT_CLINIC_OR_DEPARTMENT_OTHER)
Admission: EM | Admit: 2023-07-21 | Discharge: 2023-07-21 | Disposition: A | Discharge status: Home / Self Care | Payer: 59 | Attending: Emergency Medicine | Admitting: Emergency Medicine

## 2023-07-21 ENCOUNTER — Encounter (HOSPITAL_BASED_OUTPATIENT_CLINIC_OR_DEPARTMENT_OTHER): Payer: Self-pay | Admitting: Emergency Medicine

## 2023-07-21 DIAGNOSIS — G43909 Migraine, unspecified, not intractable, without status migrainosus: Secondary | ICD-10-CM

## 2023-07-21 DIAGNOSIS — R519 Headache, unspecified: Secondary | ICD-10-CM | POA: Diagnosis present

## 2023-07-21 DIAGNOSIS — Z20822 Contact with and (suspected) exposure to covid-19: Secondary | ICD-10-CM | POA: Insufficient documentation

## 2023-07-21 LAB — BASIC METABOLIC PANEL
Anion gap: 9 (ref 5–15)
BUN: 8 mg/dL (ref 6–20)
CO2: 23 mmol/L (ref 22–32)
Calcium: 8.7 mg/dL — ABNORMAL LOW (ref 8.9–10.3)
Chloride: 103 mmol/L (ref 98–111)
Creatinine, Ser: 0.73 mg/dL (ref 0.44–1.00)
GFR, Estimated: >60 mL/min (ref >60)
Glucose, Bld: 88 mg/dL (ref 70–99)
Potassium: 4 mmol/L (ref 3.5–5.1)
Sodium: 135 mmol/L (ref 135–145)

## 2023-07-21 LAB — RESP PANEL BY RT-PCR (RSV, FLU A&B, COVID)  RVPGX2
Influenza A by PCR: NEGATIVE
Influenza B by PCR: NEGATIVE
Resp Syncytial Virus by PCR: NEGATIVE
SARS Coronavirus 2 by RT PCR: NEGATIVE

## 2023-07-21 LAB — CBC WITH DIFFERENTIAL/PLATELET
Abs Immature Granulocytes: 0.01 10*3/uL (ref 0.00–0.07)
Basophils Absolute: 0 10*3/uL (ref 0.0–0.1)
Basophils Relative: 0 %
Eosinophils Absolute: 0.1 10*3/uL (ref 0.0–0.5)
Eosinophils Relative: 1 %
HCT: 40.8 % (ref 36.0–46.0)
Hemoglobin: 12.6 g/dL (ref 12.0–15.0)
Immature Granulocytes: 0 %
Lymphocytes Relative: 32 %
Lymphs Abs: 2.2 10*3/uL (ref 0.7–4.0)
MCH: 21.6 pg — ABNORMAL LOW (ref 26.0–34.0)
MCHC: 30.9 g/dL (ref 30.0–36.0)
MCV: 70 fL — ABNORMAL LOW (ref 80.0–100.0)
Monocytes Absolute: 0.4 10*3/uL (ref 0.1–1.0)
Monocytes Relative: 7 %
Neutro Abs: 4.1 10*3/uL (ref 1.7–7.7)
Neutrophils Relative %: 60 %
Platelets: 165 10*3/uL (ref 150–400)
RBC: 5.83 MIL/uL — ABNORMAL HIGH (ref 3.87–5.11)
RDW: 16.4 % — ABNORMAL HIGH (ref 11.5–15.5)
WBC: 6.8 10*3/uL (ref 4.0–10.5)
nRBC: 0 % (ref 0.0–0.2)

## 2023-07-21 LAB — PREGNANCY, URINE: Preg Test, Ur: NEGATIVE

## 2023-07-21 MED ORDER — METOCLOPRAMIDE HCL 5 MG/ML IJ SOLN
10.0000 mg | Freq: Once | INTRAMUSCULAR | Status: AC
  Administered 2023-07-21: 10 mg via INTRAVENOUS
  Filled 2023-07-21: qty 2

## 2023-07-21 MED ORDER — ACETAMINOPHEN 500 MG PO TABS
1000.0000 mg | ORAL_TABLET | Freq: Once | ORAL | Status: AC
  Administered 2023-07-21: 1000 mg via ORAL
  Filled 2023-07-21: qty 2

## 2023-07-21 MED ORDER — SODIUM CHLORIDE 0.9 % IV BOLUS
1000.0000 mL | Freq: Once | INTRAVENOUS | Status: AC
Start: 2023-07-21 — End: 2023-07-21
  Administered 2023-07-21: 1000 mL via INTRAVENOUS

## 2023-07-21 MED ORDER — SODIUM CHLORIDE 0.9 % IV SOLN: INTRAVENOUS | Status: DC

## 2023-07-21 MED ORDER — PROCHLORPERAZINE EDISYLATE 10 MG/2ML IJ SOLN
10.0000 mg | Freq: Once | INTRAMUSCULAR | Status: AC
  Administered 2023-07-21: 10 mg via INTRAVENOUS
  Filled 2023-07-21: qty 2

## 2023-07-21 MED ORDER — DIPHENHYDRAMINE HCL 50 MG/ML IJ SOLN
12.5000 mg | Freq: Once | INTRAMUSCULAR | Status: AC
  Administered 2023-07-21: 12.5 mg via INTRAVENOUS
  Filled 2023-07-21: qty 1

## 2023-07-21 NOTE — ED Triage Notes (Signed)
Migraine started 2 days ago, waking from sleep with migraine. Nausea and light sensitive Hx of same but this is worse Home OTC (ibuprofen and goody powder ) didn't relly help

## 2023-07-21 NOTE — ED Provider Notes (Signed)
[place] EMERGENCY DEPARTMENT AT [facility] Provider Note   CSN: [id number] Arrival date & time: 07/21/23  1137     History  Chief Complaint  Patient presents with   Migraine    Stephanie Vazquez is a 27 y.o. female.   Migraine Associated symptoms include headaches.     27 year old female presenting to the emergency department with headaches.  The patient states that 2 days ago she developed a right-sided, gradual in onset headache that was described as a throbbing sensation along her right temple.  She endorsed some light sensitivity in triage and also has noted some nausea, denies any vomiting, no sound sensitivity.  She states that she has a history of tension type headaches normally in a bandlike pattern across her forehead that normally resolve with Tylenol.  She has tried ibuprofen and Goody powders at home which did not help.  She had difficulty sleeping last night due to her headache.  She denies any sudden onset or maximal onset of her symptoms.  She denies any fevers or chills or neck stiffness or rigidity.  No sick contacts, no cough or chest discomfort.  She denies any focal neurologic deficits.  She denies any vision changes, no blurry vision, no vision loss.  No difficulty with speech, difficulty swallowing, facial droop, focal numbness or weakness.  Home Medications Prior to Admission medications   Medication Sig Start Date End Date Taking? Authorizing Provider  dicyclomine (BENTYL) 10 MG capsule Take 1 capsule (10 mg total) by mouth 4 (four) times daily -  before meals and at bedtime. 05/06/23   Raspet, Denny Peon K, PA-C  ondansetron (ZOFRAN-ODT) 4 MG disintegrating tablet Take 1 tablet (4 mg total) by mouth every 8 (eight) hours as needed for nausea or vomiting. 05/06/23   Raspet, Denny Peon K, PA-C  mirtazapine (REMERON) 15 MG tablet Take 1 tablet (15 mg total) by mouth at bedtime and may repeat dose one time if needed. Patient not taking: Reported on 01/10/2017 10/12/13  06/14/20  Chauncey Mann, MD      Allergies    Other    Review of Systems   Review of Systems  Eyes:  Positive for photophobia.  Neurological:  Positive for headaches.  All other systems reviewed and are negative.   Physical Exam Updated Vital Signs BP 137/77 (BP Location: Right Arm)   Pulse 84   Temp 98.8 F (37.1 C) (Oral)   Resp 18   LMP 06/24/2023 (Approximate)   SpO2 100%  Physical Exam Vitals and nursing note reviewed.  Constitutional:      General: She is not in acute distress.    Appearance: She is well-developed.  HENT:     Head: Normocephalic and atraumatic.  Eyes:     Conjunctiva/sclera: Conjunctivae normal.  Cardiovascular:     Rate and Rhythm: Normal rate and regular rhythm.  Pulmonary:     Effort: Pulmonary effort is normal. No respiratory distress.     Breath sounds: Normal breath sounds.  Abdominal:     Palpations: Abdomen is soft.     Tenderness: There is no abdominal tenderness.  Musculoskeletal:        General: No swelling.     Cervical back: Neck supple.  Skin:    General: Skin is warm and dry.     Capillary Refill: Capillary refill takes less than 2 seconds.  Neurological:     Mental Status: She is alert.     Comments: MENTAL STATUS EXAM:    Orientation: Alert  and oriented to person, place and time.  Memory: Cooperative, follows commands well.  Language: Speech is clear and language is normal.   CRANIAL NERVES:    CN 2 (Optic): Visual fields intact to confrontation.  CN 3,4,6 (EOM): Pupils equal and reactive to light. Full extraocular eye movement without nystagmus.  CN 5 (Trigeminal): Facial sensation is normal, no weakness of masticatory muscles.  CN 7 (Facial): No facial weakness or asymmetry.  CN 8 (Auditory): Auditory acuity grossly normal.  CN 9,10 (Glossophar): The uvula is midline, the palate elevates symmetrically.  CN 11 (spinal access): Normal sternocleidomastoid and trapezius strength.  CN 12 (Hypoglossal): The tongue is  midline. No atrophy or fasciculations.Marland Kitchen   MOTOR:  Muscle Strength: 5/5RUE, 5/5LUE, 5/5RLE, 5/5LLE.   COORDINATION:   No tremor  SENSATION:   Intact to light touch all four extremities.     Psychiatric:        Mood and Affect: Mood normal.     ED Results / Procedures / Treatments   Labs (all labs ordered are listed, but only abnormal results are displayed) Labs Reviewed  CBC WITH DIFFERENTIAL/PLATELET - Abnormal; Notable for the following components:      Result Value   RBC 5.83 (*)    MCV 70.0 (*)    MCH 21.6 (*)    RDW 16.4 (*)    All other components within normal limits  RESP PANEL BY RT-PCR (RSV, FLU A&B, COVID)  RVPGX2  PREGNANCY, URINE  BASIC METABOLIC PANEL    EKG None  Radiology No results found.  Procedures Procedures    Medications Ordered in ED Medications  sodium chloride 0.9 % bolus 1,000 mL (1,000 mLs Intravenous New Bag/Given 07/21/23 1338)    And  0.9 %  sodium chloride infusion (has no administration in time range)  prochlorperazine (COMPAZINE) injection 10 mg (has no administration in time range)  metoCLOPramide (REGLAN) injection 10 mg (10 mg Intravenous Given 07/21/23 1349)  diphenhydrAMINE (BENADRYL) injection 12.5 mg (12.5 mg Intravenous Given 07/21/23 1350)  acetaminophen (TYLENOL) tablet 1,000 mg (1,000 mg Oral Given 07/21/23 1347)    ED Course/ Medical Decision Making/ A&P                                 Medical Decision Making Amount and/or Complexity of Data Reviewed Labs: ordered.  Risk OTC drugs. Prescription drug management.    27 year old female presenting to the emergency department with headaches.  The patient states that 2 days ago she developed a right-sided, gradual in onset headache that was described as a throbbing sensation along her right temple.  She endorsed some light sensitivity in triage and also has noted some nausea, denies any vomiting, no sound sensitivity.  She states that she has a history of tension type  headaches normally in a bandlike pattern across her forehead that normally resolve with Tylenol.  She has tried ibuprofen and Goody powders at home which did not help.  She had difficulty sleeping last night due to her headache.  She denies any sudden onset or maximal onset of her symptoms.  She denies any fevers or chills or neck stiffness or rigidity.  No sick contacts, no cough or chest discomfort.  She denies any focal neurologic deficits.  She denies any vision changes, no blurry vision, no vision loss.  No difficulty with speech, difficulty swallowing, facial droop, focal numbness or weakness.  On arrival, the patient was vitally stable,  afebrile, not tachycardic or tachypneic, BP 137/77, saturating 100% on room air.  Stephanie Vazquez is a 27 y.o. female who presents with 2 days of headache as per above. I have reviewed the nursing documentation for past medical history, family history, and social history.  Currently shs is awake, alert, GCS 15, HDS, and afebrile. Her exam is most notable for fully intact extraocular motions with bilaterally reactive pupils, no focal neurologic deficits, no meningismus, and no temporal tenderness. There is no rash. The headache was not sudden onset or the worst headache of the patient's life. There is no visual deficit.  I am most concerned for migraine headache.  To further evaluate and risk stratify her, labs were obtained, which were significant for:  Labs: CBC without a leukocytosis or anemia, urine pregnancy negative.  BMP and COVID-19 influenza PCR testing collected and resulted normal.  I do not think the patient has an aneurysm, intracranial bleed, mass lesion, meningitis, temporal arteritis, stroke, cluster headache, idiopathic intracranial hypertension, cavernous sinus thrombosis, carbon monoxide toxicity, herpes zoster, carotid or vertebral artery dissection, or acute angle close glaucoma.  On reassessment, the patient remained well appearing and was  again able to ambulate without difficulty and did not have any focal neurologic deficits.  I believe the patient is stable for discharge.  We participated in shared decision making regarding continued observation in the ED versus discharge home for continued recovery after receiving the below medications. She preferred to recover at home. I believe that this is safe and reasonable.  We have discussed the diagnosis and risks, and we agree with discharging home to follow-up with their primary doctor. We also discussed returning to the Emergency Department immediately if new or worsening symptoms occur. We have discussed the symptoms which are most concerning (e.g., changing or worsening pain, weakness, vomiting, fever, or abnormal sensation) that necessitate immediate return. I provided ED return precautions. The patient felt safe with this plan.  ED Medication Summary: Medications  sodium chloride 0.9 % bolus 1,000 mL (1,000 mLs Intravenous New Bag/Given 07/21/23 1338)    And  0.9 %  sodium chloride infusion (has no administration in time range)  prochlorperazine (COMPAZINE) injection 10 mg (has no administration in time range)  metoCLOPramide (REGLAN) injection 10 mg (10 mg Intravenous Given 07/21/23 1349)  diphenhydrAMINE (BENADRYL) injection 12.5 mg (12.5 mg Intravenous Given 07/21/23 1350)  acetaminophen (TYLENOL) tablet 1,000 mg (1,000 mg Oral Given 07/21/23 1347)    Final Clinical Impression(s) / ED Diagnoses Final diagnoses:  Migraine without status migrainosus, not intractable, unspecified migraine type    Rx / DC Orders ED Discharge Orders     None         Ernie Avena, MD 07/21/23 1452

## 2023-07-21 NOTE — Discharge Instructions (Addendum)
Laboratory evaluation was reassuring.  Your neurologic exam was normal.  Your presentation was consistent with migraine headache.  Follow-up outpatient with a primary care provider.

## 2024-06-20 ENCOUNTER — Emergency Department (HOSPITAL_BASED_OUTPATIENT_CLINIC_OR_DEPARTMENT_OTHER)
Admission: EM | Admit: 2024-06-20 | Discharge: 2024-06-20 | Disposition: A | Discharge status: Home / Self Care | Payer: Self-pay

## 2024-06-20 ENCOUNTER — Encounter (HOSPITAL_BASED_OUTPATIENT_CLINIC_OR_DEPARTMENT_OTHER): Payer: Self-pay | Admitting: Emergency Medicine

## 2024-06-20 DIAGNOSIS — R42 Dizziness and giddiness: Secondary | ICD-10-CM | POA: Insufficient documentation

## 2024-06-20 DIAGNOSIS — R112 Nausea with vomiting, unspecified: Secondary | ICD-10-CM | POA: Insufficient documentation

## 2024-06-20 DIAGNOSIS — R519 Headache, unspecified: Secondary | ICD-10-CM | POA: Insufficient documentation

## 2024-06-20 LAB — COMPREHENSIVE METABOLIC PANEL WITH GFR
ALT: 7 U/L (ref 0–44)
AST: 11 U/L — ABNORMAL LOW (ref 15–41)
Albumin: 3.9 g/dL (ref 3.5–5.0)
Alkaline Phosphatase: 85 U/L (ref 38–126)
Anion gap: 11 (ref 5–15)
BUN: 12 mg/dL (ref 6–20)
CO2: 22 mmol/L (ref 22–32)
Calcium: 9.2 mg/dL (ref 8.9–10.3)
Chloride: 103 mmol/L (ref 98–111)
Creatinine, Ser: 0.76 mg/dL (ref 0.44–1.00)
GFR, Estimated: >60 mL/min (ref >60)
Glucose, Bld: 114 mg/dL — ABNORMAL HIGH (ref 70–99)
Potassium: 3.7 mmol/L (ref 3.5–5.1)
Sodium: 136 mmol/L (ref 135–145)
Total Bilirubin: 0.3 mg/dL (ref 0.0–1.2)
Total Protein: 7.5 g/dL (ref 6.5–8.1)

## 2024-06-20 LAB — CBC
HCT: 37.6 % (ref 36.0–46.0)
Hemoglobin: 11.8 g/dL — ABNORMAL LOW (ref 12.0–15.0)
MCH: 21.7 pg — ABNORMAL LOW (ref 26.0–34.0)
MCHC: 31.4 g/dL (ref 30.0–36.0)
MCV: 69.2 fL — ABNORMAL LOW (ref 80.0–100.0)
Platelets: 174 K/uL (ref 150–400)
RBC: 5.43 MIL/uL — ABNORMAL HIGH (ref 3.87–5.11)
RDW: 16.4 % — ABNORMAL HIGH (ref 11.5–15.5)
WBC: 5 K/uL (ref 4.0–10.5)
nRBC: 0 % (ref 0.0–0.2)

## 2024-06-20 LAB — URINALYSIS, ROUTINE W REFLEX MICROSCOPIC
Bilirubin Urine: NEGATIVE
Glucose, UA: NEGATIVE mg/dL
Hgb urine dipstick: NEGATIVE
Ketones, ur: NEGATIVE mg/dL
Leukocytes,Ua: NEGATIVE
Nitrite: NEGATIVE
Protein, ur: NEGATIVE mg/dL
Specific Gravity, Urine: 1.019 (ref 1.005–1.030)
pH: 7.5 (ref 5.0–8.0)

## 2024-06-20 LAB — PREGNANCY, URINE: Preg Test, Ur: NEGATIVE

## 2024-06-20 LAB — LIPASE, BLOOD: Lipase: 20 U/L (ref 11–51)

## 2024-06-20 MED ORDER — PROCHLORPERAZINE EDISYLATE 10 MG/2ML IJ SOLN
10.0000 mg | Freq: Once | INTRAMUSCULAR | Status: AC
Start: 2024-06-20 — End: 2024-06-20
  Administered 2024-06-20: 10 mg via INTRAVENOUS
  Filled 2024-06-20: qty 2

## 2024-06-20 MED ORDER — DEXAMETHASONE SODIUM PHOSPHATE 10 MG/ML IJ SOLN
10.0000 mg | Freq: Once | INTRAMUSCULAR | Status: AC
  Administered 2024-06-20: 10 mg via INTRAVENOUS
  Filled 2024-06-20: qty 1

## 2024-06-20 MED ORDER — DIPHENHYDRAMINE HCL 50 MG/ML IJ SOLN
25.0000 mg | Freq: Once | INTRAMUSCULAR | Status: AC
  Administered 2024-06-20: 25 mg via INTRAVENOUS
  Filled 2024-06-20: qty 1

## 2024-06-20 MED ORDER — KETOROLAC TROMETHAMINE 15 MG/ML IJ SOLN
15.0000 mg | Freq: Once | INTRAMUSCULAR | Status: AC
  Administered 2024-06-20: 15 mg via INTRAVENOUS
  Filled 2024-06-20: qty 1

## 2024-06-20 NOTE — Discharge Instructions (Signed)
 Please call to schedule a follow-up appointment with a primary care doctor as they may be able to start you on some medications to prevent headaches in the future.  Return to the ER for worsening symptoms.

## 2024-06-20 NOTE — ED Provider Notes (Signed)
 [place] EMERGENCY DEPARTMENT AT [facility] Provider Note   CSN: [id number] Arrival date & time: 06/20/24  1023     Patient presents with: Headache, Dizziness, and Emesis   Stephanie Vazquez is a 28 y.o. female.   28 year old female with past medical history of migraine headaches presenting to the emergency department today with right-sided headache, nausea, vomiting, and photophobia.  The patient states she has been having this intermittently now over the past 2 weeks.  She states that this feels similar to previous migraine she has had.  She denies any focal weakness, numbness, or tingling.  Denies any fevers.  She has had a few episodes of nonbloody, nonbilious emesis with this.  She reports that her last headache of the severity was last year.  She was seen here last month for a similar headache that was not quite as severe.  She came to the emergency department for further evaluation.   Headache Associated symptoms: dizziness and vomiting   Dizziness Associated symptoms: headaches and vomiting   Emesis Associated symptoms: headaches        Prior to Admission medications   Medication Sig Start Date End Date Taking? Authorizing Provider  dicyclomine  (BENTYL ) 10 MG capsule Take 1 capsule (10 mg total) by mouth 4 (four) times daily -  before meals and at bedtime. 05/06/23   Raspet, Erin K, PA-C  ondansetron  (ZOFRAN -ODT) 4 MG disintegrating tablet Take 1 tablet (4 mg total) by mouth every 8 (eight) hours as needed for nausea or vomiting. 05/06/23   Raspet, Rocky POUR, PA-C  mirtazapine  (REMERON ) 15 MG tablet Take 1 tablet (15 mg total) by mouth at bedtime and may repeat dose one time if needed. Patient not taking: Reported on 01/10/2017 10/12/13 06/14/20  Tonnie Marcey BRAVO, MD    Allergies: Other    Review of Systems  Gastrointestinal:  Positive for vomiting.  Neurological:  Positive for dizziness and headaches.  All other systems reviewed and are negative.   Updated Vital  Signs BP 121/75   Pulse 61   Temp 98.7 F (37.1 C)   Resp 18   Ht 5' 2 (1.575 m)   Wt 73.5 kg   LMP 06/02/2024   SpO2 100%   BMI 29.63 kg/m   Physical Exam Vitals and nursing note reviewed.   Gen: NAD Eyes: PERRL, EOMI HEENT: no oropharyngeal swelling Neck: trachea midline Resp: clear to auscultation bilaterally Card: RRR, no murmurs, rubs, or gallops Abd: nontender, nondistended Extremities: no calf tenderness, no edema Vascular: 2+ radial pulses bilaterally, 2+ DP pulses bilaterally Neuro: Cranial nerves intact, equal strength and sensation throughout bilateral upper and lower extremities with no dysmetria on finger-to-nose testing Skin: no rashes Psyc: acting appropriately   (all labs ordered are listed, but only abnormal results are displayed) Labs Reviewed  COMPREHENSIVE METABOLIC PANEL WITH GFR - Abnormal; Notable for the following components:      Result Value   Glucose, Bld 114 (*)    AST 11 (*)    All other components within normal limits  CBC - Abnormal; Notable for the following components:   RBC 5.43 (*)    Hemoglobin 11.8 (*)    MCV 69.2 (*)    MCH 21.7 (*)    RDW 16.4 (*)    All other components within normal limits  LIPASE, BLOOD  URINALYSIS, ROUTINE W REFLEX MICROSCOPIC  PREGNANCY, URINE    EKG: None  Radiology: No results found.   Procedures   Medications Ordered in the ED  dexamethasone  (DECADRON ) injection 10 mg (has no administration in time range)  prochlorperazine  (COMPAZINE ) injection 10 mg (10 mg Intravenous Given 06/20/24 1148)  diphenhydrAMINE  (BENADRYL ) injection 25 mg (25 mg Intravenous Given 06/20/24 1148)  ketorolac  (TORADOL ) 15 MG/ML injection 15 mg (15 mg Intravenous Given 06/20/24 1235)                                    Medical Decision Making 28 year old female with past medical history of migraine headaches presenting to the emergency department today with headache consistent with previous migraine headaches.  I  will give the patient Compazine  and Benadryl  as well as IV fluids here.  Will hold off on any imaging at this time as patient reports this is similar to previous headaches.  Suspicion for subarachnoid hemorrhage, meningitis, encephalitis, intracranial lesion/hemorrhage is relatively low at this time.  I will reevaluate for ultimate disposition.  The patient's labs are reassuring.  She is feeling better on reassessment.  She will be discharged with return precautions.  Amount and/or Complexity of Data Reviewed Labs: ordered.  Risk Prescription drug management.        Final diagnoses:  Acute nonintractable headache, unspecified headache type    ED Discharge Orders     None          Ula Prentice SAUNDERS, MD 06/20/24 1257

## 2024-06-20 NOTE — ED Triage Notes (Signed)
 Pt arrived POV with c/o headache, vomiting, and dizziness when standing for the last 2 weeks. Hx migraines. LMP beginning of July

## 2025-02-16 ENCOUNTER — Ambulatory Visit: Payer: Self-pay | Admitting: Internal Medicine
